# Patient Record
Sex: Male | Born: 1960 | Marital: Married | Smoking: Never smoker
Health system: Southern US, Community
[De-identification: ages and names within clinical notes are randomized; demographics above are authoritative.]

## PROBLEM LIST (undated history)

## (undated) DIAGNOSIS — G479 Sleep disorder, unspecified: Secondary | ICD-10-CM

## (undated) DIAGNOSIS — I10 Essential (primary) hypertension: Secondary | ICD-10-CM

## (undated) DIAGNOSIS — R079 Chest pain, unspecified: Secondary | ICD-10-CM

## (undated) HISTORY — PX: CARDIAC SURGERY: SHX584

---

## 2011-12-02 ENCOUNTER — Ambulatory Visit
Admission: RE | Admit: 2011-12-02 | Discharge: 2011-12-02 | Disposition: A | Discharge status: Home / Self Care | Payer: No Typology Code available for payment source | Source: Ambulatory Visit | Attending: Specialist | Admitting: Specialist

## 2011-12-02 ENCOUNTER — Other Ambulatory Visit: Payer: Self-pay | Admitting: Specialist

## 2011-12-02 DIAGNOSIS — R7611 Nonspecific reaction to tuberculin skin test without active tuberculosis: Secondary | ICD-10-CM

## 2015-10-14 DIAGNOSIS — E785 Hyperlipidemia, unspecified: Secondary | ICD-10-CM | POA: Insufficient documentation

## 2015-12-10 DIAGNOSIS — R7301 Impaired fasting glucose: Secondary | ICD-10-CM | POA: Insufficient documentation

## 2016-05-31 DIAGNOSIS — K760 Fatty (change of) liver, not elsewhere classified: Secondary | ICD-10-CM | POA: Insufficient documentation

## 2016-09-12 HISTORY — PX: CARDIAC SURGERY: SHX584

## 2018-10-26 DIAGNOSIS — I10 Essential (primary) hypertension: Secondary | ICD-10-CM | POA: Insufficient documentation

## 2018-10-26 DIAGNOSIS — I2510 Atherosclerotic heart disease of native coronary artery without angina pectoris: Secondary | ICD-10-CM | POA: Insufficient documentation

## 2018-10-26 DIAGNOSIS — Z951 Presence of aortocoronary bypass graft: Secondary | ICD-10-CM | POA: Insufficient documentation

## 2018-11-05 DIAGNOSIS — M545 Low back pain, unspecified: Secondary | ICD-10-CM | POA: Insufficient documentation

## 2019-05-09 ENCOUNTER — Other Ambulatory Visit: Payer: Self-pay

## 2019-05-09 DIAGNOSIS — Z20822 Contact with and (suspected) exposure to covid-19: Secondary | ICD-10-CM

## 2019-05-11 LAB — NOVEL CORONAVIRUS, NAA: SARS-CoV-2, NAA: NOT DETECTED

## 2019-08-27 DIAGNOSIS — G4733 Obstructive sleep apnea (adult) (pediatric): Secondary | ICD-10-CM | POA: Insufficient documentation

## 2021-08-01 ENCOUNTER — Emergency Department (HOSPITAL_BASED_OUTPATIENT_CLINIC_OR_DEPARTMENT_OTHER)
Admission: EM | Admit: 2021-08-01 | Discharge: 2021-08-01 | Disposition: A | Discharge status: Home / Self Care | Payer: BLUE CROSS/BLUE SHIELD | Attending: Emergency Medicine | Admitting: Emergency Medicine

## 2021-08-01 ENCOUNTER — Emergency Department (HOSPITAL_BASED_OUTPATIENT_CLINIC_OR_DEPARTMENT_OTHER): Payer: BLUE CROSS/BLUE SHIELD

## 2021-08-01 ENCOUNTER — Other Ambulatory Visit: Payer: Self-pay

## 2021-08-01 DIAGNOSIS — R109 Unspecified abdominal pain: Secondary | ICD-10-CM | POA: Insufficient documentation

## 2021-08-01 DIAGNOSIS — R519 Headache, unspecified: Secondary | ICD-10-CM | POA: Diagnosis not present

## 2021-08-01 DIAGNOSIS — M546 Pain in thoracic spine: Secondary | ICD-10-CM | POA: Diagnosis present

## 2021-08-01 DIAGNOSIS — M545 Low back pain, unspecified: Secondary | ICD-10-CM | POA: Diagnosis not present

## 2021-08-01 DIAGNOSIS — M62838 Other muscle spasm: Secondary | ICD-10-CM | POA: Diagnosis not present

## 2021-08-01 DIAGNOSIS — G8929 Other chronic pain: Secondary | ICD-10-CM | POA: Diagnosis not present

## 2021-08-01 DIAGNOSIS — R52 Pain, unspecified: Secondary | ICD-10-CM

## 2021-08-01 LAB — CBC WITH DIFFERENTIAL/PLATELET
Abs Immature Granulocytes: 0.03 10*3/uL (ref 0.00–0.07)
Basophils Absolute: 0.1 10*3/uL (ref 0.0–0.1)
Basophils Relative: 1 %
Eosinophils Absolute: 0.3 10*3/uL (ref 0.0–0.5)
Eosinophils Relative: 4 %
HCT: 40.3 % (ref 39.0–52.0)
Hemoglobin: 13.8 g/dL (ref 13.0–17.0)
Immature Granulocytes: 0 %
Lymphocytes Relative: 32 %
Lymphs Abs: 2.6 10*3/uL (ref 0.7–4.0)
MCH: 29.7 pg (ref 26.0–34.0)
MCHC: 34.2 g/dL (ref 30.0–36.0)
MCV: 86.9 fL (ref 80.0–100.0)
Monocytes Absolute: 0.7 10*3/uL (ref 0.1–1.0)
Monocytes Relative: 9 %
Neutro Abs: 4.4 10*3/uL (ref 1.7–7.7)
Neutrophils Relative %: 54 %
Platelets: 304 10*3/uL (ref 150–400)
RBC: 4.64 MIL/uL (ref 4.22–5.81)
RDW: 12.5 % (ref 11.5–15.5)
WBC: 8 10*3/uL (ref 4.0–10.5)
nRBC: 0 % (ref 0.0–0.2)

## 2021-08-01 LAB — COMPREHENSIVE METABOLIC PANEL
ALT: 14 U/L (ref 0–44)
AST: 13 U/L — ABNORMAL LOW (ref 15–41)
Albumin: 4.4 g/dL (ref 3.5–5.0)
Alkaline Phosphatase: 64 U/L (ref 38–126)
Anion gap: 7 (ref 5–15)
BUN: 11 mg/dL (ref 6–20)
CO2: 31 mmol/L (ref 22–32)
Calcium: 9.5 mg/dL (ref 8.9–10.3)
Chloride: 95 mmol/L — ABNORMAL LOW (ref 98–111)
Creatinine, Ser: 0.78 mg/dL (ref 0.61–1.24)
GFR, Estimated: >60 mL/min (ref >60)
Glucose, Bld: 92 mg/dL (ref 70–99)
Potassium: 3.6 mmol/L (ref 3.5–5.1)
Sodium: 133 mmol/L — ABNORMAL LOW (ref 135–145)
Total Bilirubin: 0.3 mg/dL (ref 0.3–1.2)
Total Protein: 7.6 g/dL (ref 6.5–8.1)

## 2021-08-01 LAB — LIPASE, BLOOD: Lipase: 28 U/L (ref 11–51)

## 2021-08-01 MED ORDER — CYCLOBENZAPRINE HCL 10 MG PO TABS: 10.0000 mg | ORAL_TABLET | Freq: Two times a day (BID) | ORAL | 0 refills | Status: AC | PRN

## 2021-08-01 MED ORDER — IOHEXOL 300 MG/ML  SOLN
100.0000 mL | Freq: Once | INTRAMUSCULAR | Status: AC | PRN
  Administered 2021-08-01: 100 mL via INTRAVENOUS

## 2021-08-01 MED ORDER — DIAZEPAM 5 MG/ML IJ SOLN
2.5000 mg | Freq: Once | INTRAMUSCULAR | Status: AC
  Administered 2021-08-01: 2.5 mg via INTRAVENOUS
  Filled 2021-08-01: qty 2

## 2021-08-01 MED ORDER — METHYLPREDNISOLONE 4 MG PO TBPK: ORAL_TABLET | ORAL | 0 refills | Status: DC

## 2021-08-01 NOTE — ED Provider Notes (Signed)
Craig Jones   CSN: [id number] Arrival date & time: 08/01/21  1754     History Chief Complaint  Patient presents with   Back Pain    Craig Jones is a 59 y.o. male.  Patient here with acute on chronic back pain.  Ongoing for close to a year but happening more in frequency here over the last several months.  Has been taking anti-inflammatories without much relief.  Complaining of headache, back pain all throughout, abdominal pain.  Denies any weakness or numbness.  No loss of bowel or bladder.  Has not had any advanced imaging done by primary care doctor.  The history is provided by the patient.  Back Pain Location:  Thoracic spine and lumbar spine Quality:  Aching Radiates to:  Does not radiate Pain severity:  Moderate Onset quality:  Gradual Duration:  10 months Timing:  Intermittent Progression:  Waxing and waning Chronicity:  Chronic Relieved by:  Nothing Worsened by:  Nothing Ineffective treatments:  None tried Associated symptoms: headaches   Associated symptoms: no abdominal pain, no chest pain, no dysuria, no fever and no numbness       No past medical history on file.  There are no problems to display for this patient.     No family history on file.     Home Medications Prior to Admission medications   Medication Sig Start Date End Date Taking? Authorizing Provider  cyclobenzaprine (FLEXERIL) 10 MG tablet Take 1 tablet (10 mg total) by mouth 2 (two) times daily as needed for up to 20 doses for muscle spasms. 08/01/21  Yes Craig Mccaughey, Craig Jones  methylPREDNISolone (MEDROL DOSEPAK) 4 MG TBPK tablet Follow package insert 08/01/21  Yes Craig Kruck, Craig Jones    Allergies    Patient has no allergy information on record.  Review of Systems   Review of Systems  Constitutional:  Negative for chills and fever.  HENT:  Negative for ear pain and sore throat.   Eyes:  Negative for pain and visual disturbance.   Respiratory:  Negative for cough and shortness of breath.   Cardiovascular:  Negative for chest pain and palpitations.  Gastrointestinal:  Negative for abdominal pain and vomiting.  Genitourinary:  Negative for dysuria and hematuria.  Musculoskeletal:  Positive for back pain. Negative for arthralgias.  Skin:  Negative for color change and rash.  Neurological:  Positive for headaches. Negative for dizziness, tremors, seizures, syncope, facial asymmetry, speech difficulty, light-headedness and numbness.  All other systems reviewed and are negative.  Physical Exam Updated Vital Signs  ED Triage Vitals  Enc Vitals Group     BP 08/01/21 1825 (!) 161/97     Pulse Rate 08/01/21 1825 67     Resp 08/01/21 1825 18     Temp 08/01/21 1825 98 F (36.7 C)     Temp Source 08/01/21 1825 Oral     SpO2 08/01/21 1825 99 %     Weight --      Height --      Head Circumference --      Peak Flow --      Pain Score 08/01/21 1835 8     Pain Loc --      Pain Edu? --      Excl. in [place]? --     Physical Exam Vitals and nursing Jones reviewed.  Constitutional:      General: He is not in acute distress.    Appearance: He is well-developed. He is not  ill-appearing.  HENT:     Head: Normocephalic and atraumatic.     Nose: Nose normal.     Mouth/Throat:     Mouth: Mucous membranes are moist.  Eyes:     Extraocular Movements: Extraocular movements intact.     Conjunctiva/sclera: Conjunctivae normal.     Pupils: Pupils are equal, round, and reactive to light.  Cardiovascular:     Rate and Rhythm: Normal rate and regular rhythm.     Pulses: Normal pulses.     Heart sounds: Normal heart sounds. No murmur heard. Pulmonary:     Effort: Pulmonary effort is normal. No respiratory distress.     Breath sounds: Normal breath sounds.  Abdominal:     General: Abdomen is flat.     Palpations: Abdomen is soft.     Tenderness: There is abdominal tenderness.     Comments: Tenderness to the right side of abdomen   Musculoskeletal:        General: Tenderness present. No swelling. Normal range of motion.     Cervical back: Normal range of motion and neck supple.     Comments: No midline spinal pain, tenderness to paraspinal muscles throughout cervical, thoracic, lumbar spine  Skin:    General: Skin is warm and dry.     Capillary Refill: Capillary refill takes less than 2 seconds.  Neurological:     General: No focal deficit present.     Mental Status: He is alert and oriented to person, place, and time.     Cranial Nerves: No cranial nerve deficit.     Sensory: No sensory deficit.     Motor: No weakness.     Coordination: Coordination normal.     Comments: 5+ out of 5 strength throughout, normal sensation, no drift, normal finger-nose-finger, normal speech  Psychiatric:        Mood and Affect: Mood normal.    ED Results / Procedures / Treatments   Labs (all labs ordered are listed, but only abnormal results are displayed) Labs Reviewed  COMPREHENSIVE METABOLIC PANEL - Abnormal; Notable for the following components:      Result Value   Sodium 133 (*)    Chloride 95 (*)    AST 13 (*)    All other components within normal limits  CBC WITH DIFFERENTIAL/PLATELET  LIPASE, BLOOD    EKG EKG Interpretation  Date/Time:  Sunday August 01 2021 21:21:22 EST Ventricular Rate:  66 PR Interval:  183 QRS Duration: 121 QT Interval:  415 QTC Calculation: 435 R Axis:   -37 Text Interpretation: Sinus rhythm Nonspecific IVCD with LAD Confirmed by Craig Jones ([id number]) on 08/01/2021 9:25:53 PM  Radiology CT Head Wo Contrast  Result Date: 08/01/2021 CLINICAL DATA:  Headache, neck pain, back spasm, back pain. EXAM: CT HEAD WITHOUT CONTRAST CT CERVICAL SPINE WITHOUT CONTRAST CT ABDOMEN AND PELVIS WITH CONTRAST TECHNIQUE: Contiguous axial images were obtained from the base of the skull through the vertex without intravenous contrast. Multidetector CT imaging of the cervical spine was performed without  intravenous contrast. Multiplanar CT image reconstructions were also generated. Multidetector CT imaging of the abdomen and pelvis was performed using the standard protocol following bolus administration of intravenous contrast. CONTRAST:  OMNIPAQUE IOHEXOL 300 MG/ML  SOLN COMPARISON:  None. FINDINGS: CT HEAD FINDINGS Brain: Normal anatomic configuration. Small arachnoid cyst within the left sylvian fissure. No abnormal intra or extra-axial mass lesion. No abnormal mass effect or midline shift. No evidence of acute intracranial hemorrhage or infarct. Ventricular size  is normal. Cerebellum unremarkable. Vascular: Unremarkable Skull: Intact Sinuses/Orbits: Paranasal sinuses are clear. Orbits are unremarkable. Other: Mastoid air cells and middle ear cavities are clear. CT CERVICAL SPINE FINDINGS Alignment: There is overall straightening of the cervical spine. 2 mm retrolisthesis of C5 upon C6, likely degenerative in nature. Skull base and vertebrae: Craniocervical alignment is normal. The atlantodental interval is not widened. No acute fracture of the cervical spine. Vertebral body height has been preserved. Soft tissues and spinal canal: Mild retrolisthesis at C5-6 in combination with posterior disc osteophyte complex results in mild central canal stenosis with minimal flattening of the thecal sac. Mild central posterior disc bulge at C3-4 abuts but does not remodel the thecal sac no canal hematoma. No prevertebral soft tissue swelling or fluid collections are identified. The cervical soft tissues are otherwise unremarkable. Disc levels: There is intervertebral disc space narrowing and endplate remodeling at C5-6 in keeping with changes of moderate degenerative disc disease. Milder degenerative changes are noted at C4-5. The prevertebral soft tissues are not thickened on sagittal reformats. Review of the axial images demonstrates mild bilateral neuroforaminal narrowing at C4-5 and C5-6 secondary to uncovertebral  arthrosis. Remaining neural foramina are widely patent. Upper chest: Negative. Other: None CT ABDOMEN AND PELVIS FINDINGS Lower chest: Coronary artery bypass grafting has been performed. Cardiac size is mildly enlarged. Visualized lung bases are clear. Hepatobiliary: No focal liver abnormality is seen. No gallstones, gallbladder wall thickening, or biliary dilatation. Pancreas: Unremarkable Spleen: Unremarkable Adrenals/Urinary Tract: Adrenal glands are unremarkable. Kidneys are normal, without renal calculi, focal lesion, or hydronephrosis. Bladder is unremarkable. Stomach/Bowel: Stomach is within normal limits. Appendix appears normal. No evidence of bowel wall thickening, distention, or inflammatory changes. Vascular/Lymphatic: Aortic atherosclerosis. No enlarged abdominal or pelvic lymph nodes. Reproductive: Prostate is unremarkable. Other: Small bilateral fat containing inguinal hernias are present. Musculoskeletal: Multiple Schmorl's nodes noted within the lumbar spine within the L1-L4 vertebral bodies. No acute bone abnormality. No lytic or blastic bone lesion identified. IMPRESSION: No acute intracranial abnormality.  No calvarial fracture. No acute fracture or listhesis of the cervical spine. Mild-to-moderate degenerative disc and degenerative joint disease at C4-C6 resulting in mild bilateral neuroforaminal narrowing and mild central canal stenosis. Status post coronary artery bypass grafting.  Mild cardiomegaly. No acute intra-abdominal pathology identified. Electronically Signed   By: Craig Numbers M.D.   On: 08/01/2021 21:53   CT Cervical Spine Wo Contrast  Result Date: 08/01/2021 CLINICAL DATA:  Headache, neck pain, back spasm, back pain. EXAM: CT HEAD WITHOUT CONTRAST CT CERVICAL SPINE WITHOUT CONTRAST CT ABDOMEN AND PELVIS WITH CONTRAST TECHNIQUE: Contiguous axial images were obtained from the base of the skull through the vertex without intravenous contrast. Multidetector CT imaging of the  cervical spine was performed without intravenous contrast. Multiplanar CT image reconstructions were also generated. Multidetector CT imaging of the abdomen and pelvis was performed using the standard protocol following bolus administration of intravenous contrast. CONTRAST:  OMNIPAQUE IOHEXOL 300 MG/ML  SOLN COMPARISON:  None. FINDINGS: CT HEAD FINDINGS Brain: Normal anatomic configuration. Small arachnoid cyst within the left sylvian fissure. No abnormal intra or extra-axial mass lesion. No abnormal mass effect or midline shift. No evidence of acute intracranial hemorrhage or infarct. Ventricular size is normal. Cerebellum unremarkable. Vascular: Unremarkable Skull: Intact Sinuses/Orbits: Paranasal sinuses are clear. Orbits are unremarkable. Other: Mastoid air cells and middle ear cavities are clear. CT CERVICAL SPINE FINDINGS Alignment: There is overall straightening of the cervical spine. 2 mm retrolisthesis of C5 upon C6, likely degenerative  in nature. Skull base and vertebrae: Craniocervical alignment is normal. The atlantodental interval is not widened. No acute fracture of the cervical spine. Vertebral body height has been preserved. Soft tissues and spinal canal: Mild retrolisthesis at C5-6 in combination with posterior disc osteophyte complex results in mild central canal stenosis with minimal flattening of the thecal sac. Mild central posterior disc bulge at C3-4 abuts but does not remodel the thecal sac no canal hematoma. No prevertebral soft tissue swelling or fluid collections are identified. The cervical soft tissues are otherwise unremarkable. Disc levels: There is intervertebral disc space narrowing and endplate remodeling at 075-GRM in keeping with changes of moderate degenerative disc disease. Milder degenerative changes are noted at C4-5. The prevertebral soft tissues are not thickened on sagittal reformats. Review of the axial images demonstrates mild bilateral neuroforaminal narrowing at  C4-5 and C5-6 secondary to uncovertebral arthrosis. Remaining neural foramina are widely patent. Upper chest: Negative. Other: None CT ABDOMEN AND PELVIS FINDINGS Lower chest: Coronary artery bypass grafting has been performed. Cardiac size is mildly enlarged. Visualized lung bases are clear. Hepatobiliary: No focal liver abnormality is seen. No gallstones, gallbladder wall thickening, or biliary dilatation. Pancreas: Unremarkable Spleen: Unremarkable Adrenals/Urinary Tract: Adrenal glands are unremarkable. Kidneys are normal, without renal calculi, focal lesion, or hydronephrosis. Bladder is unremarkable. Stomach/Bowel: Stomach is within normal limits. Appendix appears normal. No evidence of bowel wall thickening, distention, or inflammatory changes. Vascular/Lymphatic: Aortic atherosclerosis. No enlarged abdominal or pelvic lymph nodes. Reproductive: Prostate is unremarkable. Other: Small bilateral fat containing inguinal hernias are present. Musculoskeletal: Multiple Schmorl's nodes noted within the lumbar spine within the L1-L4 vertebral bodies. No acute bone abnormality. No lytic or blastic bone lesion identified. IMPRESSION: No acute intracranial abnormality.  No calvarial fracture. No acute fracture or listhesis of the cervical spine. Mild-to-moderate degenerative disc and degenerative joint disease at C4-C6 resulting in mild bilateral neuroforaminal narrowing and mild central canal stenosis. Status post coronary artery bypass grafting.  Mild cardiomegaly. No acute intra-abdominal pathology identified. Electronically Signed   By: Craig Salisbury M.D.   On: 08/01/2021 21:53   CT Thoracic Spine Wo Contrast  Result Date: 08/01/2021 CLINICAL DATA:  Back pain EXAM: CT Thoracic and Lumbar spine without contrast TECHNIQUE: Multiplanar CT images of the thoracic and lumbar spine were reconstructed from contemporary CT of the Chest, Abdomen, and Pelvis CONTRAST:  None or No additional COMPARISON:  None FINDINGS: CT  THORACIC SPINE FINDINGS Alignment: Normal. Vertebrae: No acute fracture or focal pathologic process. Paraspinal and other soft tissues: Negative. Disc levels: No spinal canal stenosis. CT LUMBAR SPINE FINDINGS Segmentation: 5 lumbar type vertebrae. Alignment: Normal. Vertebrae: Schmorl's nodes at L2, L3 and L4. Paraspinal and other soft tissues: Negative. Disc levels: There is no spinal canal stenosis. There is mild left neural foraminal stenosis at L3-4 and L4-5. IMPRESSION: 1. No acute fracture or static subluxation of the thoracic or lumbar spine. 2. Mild left L3-4 and L4-5 neural foraminal stenosis. Electronically Signed   By: Craig Jarred M.D.   On: 08/01/2021 21:47   CT ABDOMEN PELVIS W CONTRAST  Result Date: 08/01/2021 CLINICAL DATA:  Headache, neck pain, back spasm, back pain. EXAM: CT HEAD WITHOUT CONTRAST CT CERVICAL SPINE WITHOUT CONTRAST CT ABDOMEN AND PELVIS WITH CONTRAST TECHNIQUE: Contiguous axial images were obtained from the base of the skull through the vertex without intravenous contrast. Multidetector CT imaging of the cervical spine was performed without intravenous contrast. Multiplanar CT image reconstructions were also generated. Multidetector CT imaging of the abdomen  and pelvis was performed using the standard protocol following bolus administration of intravenous contrast. CONTRAST:  190mL OMNIPAQUE IOHEXOL 300 MG/ML  SOLN COMPARISON:  None. FINDINGS: CT HEAD FINDINGS Brain: Normal anatomic configuration. Small arachnoid cyst within the left sylvian fissure. No abnormal intra or extra-axial mass lesion. No abnormal mass effect or midline shift. No evidence of acute intracranial hemorrhage or infarct. Ventricular size is normal. Cerebellum unremarkable. Vascular: Unremarkable Skull: Intact Sinuses/Orbits: Paranasal sinuses are clear. Orbits are unremarkable. Other: Mastoid air cells and middle ear cavities are clear. CT CERVICAL SPINE FINDINGS Alignment: There is overall straightening  of the cervical spine. 2 mm retrolisthesis of C5 upon C6, likely degenerative in nature. Skull base and vertebrae: Craniocervical alignment is normal. The atlantodental interval is not widened. No acute fracture of the cervical spine. Vertebral body height has been preserved. Soft tissues and spinal canal: Mild retrolisthesis at C5-6 in combination with posterior disc osteophyte complex results in mild central canal stenosis with minimal flattening of the thecal sac. Mild central posterior disc bulge at C3-4 abuts but does not remodel the thecal sac no canal hematoma. No prevertebral soft tissue swelling or fluid collections are identified. The cervical soft tissues are otherwise unremarkable. Disc levels: There is intervertebral disc space narrowing and endplate remodeling at 075-GRM in keeping with changes of moderate degenerative disc disease. Milder degenerative changes are noted at C4-5. The prevertebral soft tissues are not thickened on sagittal reformats. Review of the axial images demonstrates mild bilateral neuroforaminal narrowing at C4-5 and C5-6 secondary to uncovertebral arthrosis. Remaining neural foramina are widely patent. Upper chest: Negative. Other: None CT ABDOMEN AND PELVIS FINDINGS Lower chest: Coronary artery bypass grafting has been performed. Cardiac size is mildly enlarged. Visualized lung bases are clear. Hepatobiliary: No focal liver abnormality is seen. No gallstones, gallbladder wall thickening, or biliary dilatation. Pancreas: Unremarkable Spleen: Unremarkable Adrenals/Urinary Tract: Adrenal glands are unremarkable. Kidneys are normal, without renal calculi, focal lesion, or hydronephrosis. Bladder is unremarkable. Stomach/Bowel: Stomach is within normal limits. Appendix appears normal. No evidence of bowel wall thickening, distention, or inflammatory changes. Vascular/Lymphatic: Aortic atherosclerosis. No enlarged abdominal or pelvic lymph nodes. Reproductive: Prostate is unremarkable.  Other: Small bilateral fat containing inguinal hernias are present. Musculoskeletal: Multiple Schmorl's nodes noted within the lumbar spine within the L1-L4 vertebral bodies. No acute bone abnormality. No lytic or blastic bone lesion identified. IMPRESSION: No acute intracranial abnormality.  No calvarial fracture. No acute fracture or listhesis of the cervical spine. Mild-to-moderate degenerative disc and degenerative joint disease at C4-C6 resulting in mild bilateral neuroforaminal narrowing and mild central canal stenosis. Status post coronary artery bypass grafting.  Mild cardiomegaly. No acute intra-abdominal pathology identified. Electronically Signed   By: Craig Salisbury M.D.   On: 08/01/2021 21:53   CT L-SPINE NO CHARGE  Result Date: 08/01/2021 CLINICAL DATA:  Back pain EXAM: CT Thoracic and Lumbar spine without contrast TECHNIQUE: Multiplanar CT images of the thoracic and lumbar spine were reconstructed from contemporary CT of the Chest, Abdomen, and Pelvis CONTRAST:  None or No additional COMPARISON:  None FINDINGS: CT THORACIC SPINE FINDINGS Alignment: Normal. Vertebrae: No acute fracture or focal pathologic process. Paraspinal and other soft tissues: Negative. Disc levels: No spinal canal stenosis. CT LUMBAR SPINE FINDINGS Segmentation: 5 lumbar type vertebrae. Alignment: Normal. Vertebrae: Schmorl's nodes at L2, L3 and L4. Paraspinal and other soft tissues: Negative. Disc levels: There is no spinal canal stenosis. There is mild left neural foraminal stenosis at L3-4 and L4-5. IMPRESSION: 1. No acute  fracture or static subluxation of the thoracic or lumbar spine. 2. Mild left L3-4 and L4-5 neural foraminal stenosis. Electronically Signed   By: Craig Jarred M.D.   On: 08/01/2021 21:47    Procedures Procedures   Medications Ordered in ED Medications  diazepam (VALIUM) injection 2.5 mg (2.5 mg Intravenous Given 08/01/21 1947)  iohexol (OMNIPAQUE) 300 MG/ML solution 100 mL (100 mLs Intravenous  Contrast Given 08/01/21 2044)    ED Course  I have reviewed the triage vital signs and the nursing notes.  Pertinent labs & imaging results that were available during my care of the patient were reviewed by me and considered in my medical decision making (see chart for details).    MDM Rules/Calculators/A&P                           Crockett Ridder is here with back pain.  History of CAD.  Has been having acute on chronic back pain for the last several months to over a year.  Increased frequency recently.  Having some abdominal pain as well.  Denies any chest pain or shortness of breath.  No midline spinal pain.  Neurologically he is intact.  No symptoms of cauda equina.  Has normal strength and sensation throughout.  Pain mostly in the paraspinal muscles bilaterally throughout his upper and lower back.  He is very uncomfortable.  Felt much better after IV Valium.  CT scan of his head, spine, abdomen and pelvis were obtained that were overall unremarkable.  No acute process.  He does have some degenerative processes in his cervical spine and lower spine.  No significant anemia or electrolyte abnormality, kidney injury.  No concern for dissection or other cardiac or pulmonary or vascular process.  Has good pulses in his lower extremities.  Overall he is having some right-sided sciatic symptoms.  We will prescribe Medrol Dosepak and Flexeril and have him follow-up with sports medicine.  Discharged in good condition.  Understands return precautions.  This chart was dictated using voice recognition software.  Despite best efforts to proofread,  errors can occur which can change the documentation meaning.   Final Clinical Impression(s) / ED Diagnoses Final diagnoses:  Pain  Chronic back pain, unspecified back location, unspecified back pain laterality  Nonintractable headache, unspecified chronicity pattern, unspecified headache type  Muscle spasm    Rx / DC Orders ED Discharge Orders           Ordered    methylPREDNISolone (MEDROL DOSEPAK) 4 MG TBPK tablet        08/01/21 2215    cyclobenzaprine (FLEXERIL) 10 MG tablet  2 times daily PRN        08/01/21 2215             Craig Sites, Craig Jones 08/01/21 2218

## 2021-08-01 NOTE — ED Triage Notes (Signed)
He c/o ~ 3 month hx of occasional, severe, debilitating "back spasms". He expresses frustration that his pcp is not performing more sophisticated testing, such as CT. He denies any paresthesias and is in no distress.

## 2021-08-01 NOTE — Discharge Instructions (Addendum)
Recommend 1000 mg of Tylenol every 6 hours as needed for pain.  Recommend 800 mg ibuprofen every 8 hours as needed for pain.  Take Medrol Dosepak as prescribed.  Take Flexeril as prescribed.  Be careful as Flexeril can be sedating as discussed.  Do not drive or operate heavy machinery while using this medicine.  Follow-up with sports medicine doctor.

## 2021-09-03 ENCOUNTER — Encounter (HOSPITAL_BASED_OUTPATIENT_CLINIC_OR_DEPARTMENT_OTHER): Payer: Self-pay

## 2021-09-03 ENCOUNTER — Emergency Department (HOSPITAL_BASED_OUTPATIENT_CLINIC_OR_DEPARTMENT_OTHER)
Admission: EM | Admit: 2021-09-03 | Discharge: 2021-09-03 | Disposition: A | Discharge status: Home / Self Care | Payer: BLUE CROSS/BLUE SHIELD | Attending: Emergency Medicine | Admitting: Emergency Medicine

## 2021-09-03 ENCOUNTER — Emergency Department (HOSPITAL_BASED_OUTPATIENT_CLINIC_OR_DEPARTMENT_OTHER): Payer: BLUE CROSS/BLUE SHIELD

## 2021-09-03 ENCOUNTER — Emergency Department (HOSPITAL_BASED_OUTPATIENT_CLINIC_OR_DEPARTMENT_OTHER): Payer: BLUE CROSS/BLUE SHIELD | Admitting: Radiology

## 2021-09-03 ENCOUNTER — Other Ambulatory Visit: Payer: Self-pay

## 2021-09-03 DIAGNOSIS — I2510 Atherosclerotic heart disease of native coronary artery without angina pectoris: Secondary | ICD-10-CM | POA: Insufficient documentation

## 2021-09-03 DIAGNOSIS — R9431 Abnormal electrocardiogram [ECG] [EKG]: Secondary | ICD-10-CM | POA: Insufficient documentation

## 2021-09-03 DIAGNOSIS — R071 Chest pain on breathing: Secondary | ICD-10-CM | POA: Insufficient documentation

## 2021-09-03 DIAGNOSIS — R079 Chest pain, unspecified: Secondary | ICD-10-CM

## 2021-09-03 DIAGNOSIS — J9811 Atelectasis: Secondary | ICD-10-CM | POA: Insufficient documentation

## 2021-09-03 LAB — TROPONIN I (HIGH SENSITIVITY)
Troponin I (High Sensitivity): 4 ng/L (ref <18)
Troponin I (High Sensitivity): 4 ng/L (ref <18)

## 2021-09-03 LAB — BASIC METABOLIC PANEL
Anion gap: 9 (ref 5–15)
BUN: 17 mg/dL (ref 6–20)
CO2: 30 mmol/L (ref 22–32)
Calcium: 9.4 mg/dL (ref 8.9–10.3)
Chloride: 91 mmol/L — ABNORMAL LOW (ref 98–111)
Creatinine, Ser: 0.81 mg/dL (ref 0.61–1.24)
GFR, Estimated: >60 mL/min (ref >60)
Glucose, Bld: 101 mg/dL — ABNORMAL HIGH (ref 70–99)
Potassium: 3 mmol/L — ABNORMAL LOW (ref 3.5–5.1)
Sodium: 130 mmol/L — ABNORMAL LOW (ref 135–145)

## 2021-09-03 LAB — CBC
HCT: 42.5 % (ref 39.0–52.0)
Hemoglobin: 14.3 g/dL (ref 13.0–17.0)
MCH: 29.1 pg (ref 26.0–34.0)
MCHC: 33.6 g/dL (ref 30.0–36.0)
MCV: 86.4 fL (ref 80.0–100.0)
Platelets: 422 10*3/uL — ABNORMAL HIGH (ref 150–400)
RBC: 4.92 MIL/uL (ref 4.22–5.81)
RDW: 13.1 % (ref 11.5–15.5)
WBC: 11 10*3/uL — ABNORMAL HIGH (ref 4.0–10.5)
nRBC: 0 % (ref 0.0–0.2)

## 2021-09-03 MED ORDER — IOHEXOL 350 MG/ML SOLN
100.0000 mL | Freq: Once | INTRAVENOUS | Status: AC | PRN
  Administered 2021-09-03: 04:00:00 100 mL via INTRAVENOUS

## 2021-09-03 MED ORDER — HYDROCODONE-ACETAMINOPHEN 5-325 MG PO TABS
2.0000 | ORAL_TABLET | Freq: Once | ORAL | Status: AC
  Administered 2021-09-03: 07:00:00 2 via ORAL
  Filled 2021-09-03: qty 2

## 2021-09-03 MED ORDER — HYDROCODONE-ACETAMINOPHEN 5-325 MG PO TABS: 2.0000 | ORAL_TABLET | ORAL | 0 refills | Status: DC | PRN

## 2021-09-03 MED ORDER — MORPHINE SULFATE (PF) 4 MG/ML IV SOLN
4.0000 mg | Freq: Once | INTRAVENOUS | Status: AC
  Administered 2021-09-03: 04:00:00 4 mg via INTRAVENOUS
  Filled 2021-09-03: qty 1

## 2021-09-03 NOTE — ED Notes (Signed)
Pt given morphine for pain and gone to CT

## 2021-09-03 NOTE — ED Provider Notes (Signed)
[facility] EMERGENCY DEPT Provider Note   CSN: [id number] Arrival date & time: 09/03/21  0128     History Chief Complaint  Patient presents with   Chest Pain   Shortness of Breath    Craig Jones is a 60 y.o. male.  Patient is a 60 year old male patient who presents with chest pain.  He has had a prior history of coronary artery disease status post bypass surgery 4 years ago.  He reports a 2-day history of pain in his left chest.  Its otherwise nonradiating.  Describes it as a sharp pain that is worse with movement and worse with deep breathing.  He says it is very intense.  He says he feels associated shortness of breath and nausea.  Its been constant for the last 2 days.  It started suddenly.  He has no known injuries.  He does have some ongoing pain in his left neck and the left side of his head.  He had a recent MRI.  He has been treated by his doctors for the cervical pain.  He says the chest pain does not feel related to the neck pain.  He denies any numbness or weakness to his extremities.  No cough or cold symptoms.  History was obtained through video language line.      History reviewed. No pertinent past medical history.  There are no problems to display for this patient.    The histories are not reviewed yet. Please review them in the "History" navigator section and refresh this [place].     No family history on file.     Home Medications Prior to Admission medications   Medication Sig Start Date End Date Taking? Authorizing Provider  HYDROcodone-acetaminophen (NORCO/VICODIN) 5-325 MG tablet Take 2 tablets by mouth every 4 (four) hours as needed. 09/03/21  Yes Malvin Johns, MD  cyclobenzaprine (FLEXERIL) 10 MG tablet Take 1 tablet (10 mg total) by mouth 2 (two) times daily as needed for up to 20 doses for muscle spasms. 08/01/21   Lennice Sites, DO  methylPREDNISolone (MEDROL DOSEPAK) 4 MG TBPK tablet Follow package insert 08/01/21   Lennice Sites, DO    Allergies    Patient has no known allergies.  Review of Systems   Review of Systems  Constitutional:  Negative for chills, diaphoresis, fatigue and fever.  HENT:  Negative for congestion, rhinorrhea and sneezing.   Eyes: Negative.   Respiratory:  Positive for shortness of breath. Negative for cough and chest tightness.   Cardiovascular:  Positive for chest pain. Negative for leg swelling.  Gastrointestinal:  Negative for abdominal pain, blood in stool, diarrhea, nausea and vomiting.  Genitourinary:  Negative for difficulty urinating, flank pain, frequency and hematuria.  Musculoskeletal:  Positive for back pain and neck pain. Negative for arthralgias.       Neck and back pain which have been going on for several months  Skin:  Negative for rash.  Neurological:  Negative for dizziness, speech difficulty, weakness, numbness and headaches.   Physical Exam Updated Vital Signs BP 137/89    Pulse 77    Temp 98.5 F (36.9 C)    Resp 16    SpO2 96%   Physical Exam Constitutional:      Appearance: He is well-developed.  HENT:     Head: Normocephalic and atraumatic.  Eyes:     Pupils: Pupils are equal, round, and reactive to light.  Cardiovascular:     Rate and Rhythm: Normal rate and regular rhythm.  Heart sounds: Normal heart sounds.  Pulmonary:     Effort: Pulmonary effort is normal. No respiratory distress.     Breath sounds: Normal breath sounds. No wheezing or rales.  Chest:     Chest wall: Tenderness (Tenderness on palpation of the left chest wall, no crepitus or deformity, no rashes) present.  Abdominal:     General: Bowel sounds are normal.     Palpations: Abdomen is soft.     Tenderness: There is no abdominal tenderness. There is no guarding or rebound.  Musculoskeletal:        General: Normal range of motion.     Cervical back: Normal range of motion and neck supple.     Comments: Radial pulses are intact, he has normal sensation and motor function to  the upper extremities, pedal pulses are intact  Lymphadenopathy:     Cervical: No cervical adenopathy.  Skin:    General: Skin is warm and dry.     Findings: No rash.  Neurological:     Mental Status: He is alert and oriented to person, place, and time.    ED Results / Procedures / Treatments   Labs (all labs ordered are listed, but only abnormal results are displayed) Labs Reviewed  BASIC METABOLIC PANEL - Abnormal; Notable for the following components:      Result Value   Sodium 130 (*)    Potassium 3.0 (*)    Chloride 91 (*)    Glucose, Bld 101 (*)    All other components within normal limits  CBC - Abnormal; Notable for the following components:   WBC 11.0 (*)    Platelets 422 (*)    All other components within normal limits  TROPONIN I (HIGH SENSITIVITY)  TROPONIN I (HIGH SENSITIVITY)    EKG EKG Interpretation  Date/Time:  Friday September 03 2021 01:49:24 EST Ventricular Rate:  85 PR Interval:  166 QRS Duration: 112 QT Interval:  370 QTC Calculation: 440 R Axis:   75 Text Interpretation: Normal sinus rhythm Abnormal QRS-T angle, consider primary T wave abnormality Abnormal ECG since last tracing no significant change Confirmed by Malvin Johns [phone]) on 09/03/2021 2:35:01 AM  Radiology DG Chest 2 View  Result Date: 09/03/2021 CLINICAL DATA:  Chest pain. EXAM: CHEST - 2 VIEW COMPARISON:  12/02/2011 FINDINGS: Lungs are clear. No pneumothorax or pleural effusion. Coronary artery bypass grafting has been performed. Cardiac size within normal limits. Pulmonary vascularity is normal. No acute bone abnormality. IMPRESSION: No active cardiopulmonary disease. Electronically Signed   By: Fidela Salisbury M.D.   On: 09/03/2021 03:10   CT Angio Chest/Abd/Pel for Dissection W and/or W/WO  Result Date: 09/03/2021 CLINICAL DATA:  60 year old male with chest and back pain since last night. EXAM: CT ANGIOGRAPHY CHEST, ABDOMEN AND PELVIS TECHNIQUE: Non-contrast CT of the chest  was initially obtained. Multidetector CT imaging through the chest, abdomen and pelvis was performed using the standard protocol during bolus administration of intravenous contrast. Multiplanar reconstructed images and MIPs were obtained and reviewed to evaluate the vascular anatomy. CONTRAST:  156mL OMNIPAQUE IOHEXOL 350 MG/ML SOLN COMPARISON:  Chest radiographs 0233 hours today. CT Abdomen and Pelvis 08/01/2021. FINDINGS: CTA CHEST FINDINGS Cardiovascular: Prior sternotomy and CABG. Stable borderline cardiomegaly. No pericardial effusion. Aortic atherosclerosis but negative for thoracic aortic dissection or aneurysm. Patent proximal great vessels. Central pulmonary arteries are also enhancing and appear to be patent. Mediastinum/Nodes: Negative. No mediastinal mass or lymphadenopathy. Lungs/Pleura: Major airways are patent. There is mild dependent pulmonary atelectasis  similar to the November CT. No pneumothorax, pleural effusion, or other acute pulmonary abnormality. Musculoskeletal: Prior sternotomy. No acute osseous abnormality identified. Review of the MIP images confirms the above findings. CTA ABDOMEN AND PELVIS FINDINGS VASCULAR Negative for abdominal aortic aneurysm or dissection. Stable Aortoiliac calcified atherosclerosis, with primarily distal aortic atherosclerosis. Bilateral iliac artery tortuosity, plaque and stenosis but the major arterial structures in the abdomen and pelvis remain patent. Review of the MIP images confirms the above findings. NON-VASCULAR Hepatobiliary: Stable, negative liver and gallbladder. Pancreas: Negative. Spleen: Negative. Adrenals/Urinary Tract: Normal adrenal glands. Symmetric, nonobstructed kidneys. Decompressed ureters. Distended urinary bladder (510 mL). No perivesical stranding. Ureters are within normal limits. No urinary calculus. Stomach/Bowel: Mild retained stool throughout the large bowel with occasional diverticula. Normal appendix on series 5, image 241. No  large bowel inflammation. Negative terminal ileum. No dilated small bowel. Unremarkable stomach and duodenum. No free air, free fluid, mesenteric stranding. Lymphatic: No lymphadenopathy. Reproductive: Negative. Other: No pelvic free fluid. Musculoskeletal: Multilevel lumbar endplate Schmorl's nodes are stable. No acute osseous abnormality identified. Review of the MIP images confirms the above findings. IMPRESSION: 1. Negative for aortic dissection or aneurysm. Positive for Aortic Atherosclerosis (ICD10-I70.0), prior CABG. 2. Distended urinary bladder (510 mL).  Query urinary retention. 3. Mild pulmonary atelectasis. 4. No other acute or inflammatory process identified in the chest, abdomen, or pelvis. Electronically Signed   By: Odessa Fleming M.D.   On: 09/03/2021 05:23    Procedures Procedures   Medications Ordered in ED Medications  morphine 4 MG/ML injection 4 mg (4 mg Intravenous Given 09/03/21 0410)  iohexol (OMNIPAQUE) 350 MG/ML injection 100 mL (100 mLs Intravenous Contrast Given 09/03/21 0421)    ED Course  I have reviewed the triage vital signs and the nursing notes.  Pertinent labs & imaging results that were available during my care of the patient were reviewed by me and considered in my medical decision making (see chart for details).    MDM Rules/Calculators/A&P                         Patient is a 60 year old male who presents with left-sided chest pain.  He feels like it is made worse from his worsening neck pain and has had recently although it does not seem to be radiating from his neck.  It is worse with movement and deep breathing.  It is worse when he moves his left arm around.  It somewhat reproducible.  He has no other associated symptoms.  He has had 2 negative troponins.  No ischemic changes noted on EKG.  He had a CTA of his chest which shows a normal aorta.  No obvious PE.  No hypoxia or tachycardia or other suggestions of PE.  It seems to be musculoskeletal in nature,  possible pleurisy.  He is improved after treatment with pain medication in the ED.  I have a low suspicion for ACS.  Its very atypical.  He was discharged home in good condition.  He was given a prescription for small amount of pain medicine and encouraged to have close follow-up with his PCP.  Return precautions were given.  Of note his urinary bladder was distended on the CT.  Once he got back from CT, he urinated about a liter.  He has no suggestions of retention.    Final Clinical Impression(s) / ED Diagnoses Final diagnoses:  Chest pain, unspecified type    Rx / DC Orders ED Discharge Orders  Ordered    HYDROcodone-acetaminophen (NORCO/VICODIN) 5-325 MG tablet  Every 4 hours PRN        09/03/21 [id number]             Malvin Johns, MD 09/03/21 [phone]

## 2021-09-03 NOTE — ED Triage Notes (Signed)
°  Patient comes in with chest pain that started last night.  Patient endorses L sided chest pain, with left arm pain, dyspnea, and nausea.  Patient had open heart surgery 4 years ago.  Family states he also has cervical injury that causes him pain that is hard to manage.  Pain 10/10 pressure in chest.

## 2021-09-03 NOTE — Discharge Instructions (Signed)
Follow-up with your primary care doctor.  Return here as needed for any worsening symptoms. 

## 2021-09-17 ENCOUNTER — Emergency Department (HOSPITAL_BASED_OUTPATIENT_CLINIC_OR_DEPARTMENT_OTHER): Payer: 59 | Admitting: Radiology

## 2021-09-17 ENCOUNTER — Emergency Department (HOSPITAL_BASED_OUTPATIENT_CLINIC_OR_DEPARTMENT_OTHER)
Admission: EM | Admit: 2021-09-17 | Discharge: 2021-09-17 | Disposition: A | Discharge status: Home / Self Care | Payer: 59 | Attending: Emergency Medicine | Admitting: Emergency Medicine

## 2021-09-17 ENCOUNTER — Other Ambulatory Visit: Payer: Self-pay

## 2021-09-17 ENCOUNTER — Encounter (HOSPITAL_BASED_OUTPATIENT_CLINIC_OR_DEPARTMENT_OTHER): Payer: Self-pay | Admitting: Obstetrics and Gynecology

## 2021-09-17 DIAGNOSIS — G894 Chronic pain syndrome: Secondary | ICD-10-CM | POA: Diagnosis not present

## 2021-09-17 DIAGNOSIS — Z20822 Contact with and (suspected) exposure to covid-19: Secondary | ICD-10-CM | POA: Insufficient documentation

## 2021-09-17 DIAGNOSIS — G4700 Insomnia, unspecified: Secondary | ICD-10-CM | POA: Insufficient documentation

## 2021-09-17 HISTORY — DX: Essential (primary) hypertension: I10

## 2021-09-17 HISTORY — DX: Sleep disorder, unspecified: G47.9

## 2021-09-17 HISTORY — DX: Chest pain, unspecified: R07.9

## 2021-09-17 LAB — BASIC METABOLIC PANEL
Anion gap: 9 (ref 5–15)
BUN: 12 mg/dL (ref 6–20)
CO2: 29 mmol/L (ref 22–32)
Calcium: 9.6 mg/dL (ref 8.9–10.3)
Chloride: 92 mmol/L — ABNORMAL LOW (ref 98–111)
Creatinine, Ser: 0.78 mg/dL (ref 0.61–1.24)
GFR, Estimated: >60 mL/min (ref >60)
Glucose, Bld: 111 mg/dL — ABNORMAL HIGH (ref 70–99)
Potassium: 3.7 mmol/L (ref 3.5–5.1)
Sodium: 130 mmol/L — ABNORMAL LOW (ref 135–145)

## 2021-09-17 LAB — RESP PANEL BY RT-PCR (FLU A&B, COVID) ARPGX2
Influenza A by PCR: NEGATIVE
Influenza B by PCR: NEGATIVE
SARS Coronavirus 2 by RT PCR: NEGATIVE

## 2021-09-17 LAB — CBC
HCT: 39.6 % (ref 39.0–52.0)
Hemoglobin: 13.5 g/dL (ref 13.0–17.0)
MCH: 29 pg (ref 26.0–34.0)
MCHC: 34.1 g/dL (ref 30.0–36.0)
MCV: 85 fL (ref 80.0–100.0)
Platelets: 457 10*3/uL — ABNORMAL HIGH (ref 150–400)
RBC: 4.66 MIL/uL (ref 4.22–5.81)
RDW: 12.9 % (ref 11.5–15.5)
WBC: 11 10*3/uL — ABNORMAL HIGH (ref 4.0–10.5)
nRBC: 0 % (ref 0.0–0.2)

## 2021-09-17 LAB — TROPONIN I (HIGH SENSITIVITY)
Troponin I (High Sensitivity): 2 ng/L (ref <18)
Troponin I (High Sensitivity): 2 ng/L (ref <18)

## 2021-09-17 LAB — D-DIMER, QUANTITATIVE: D-Dimer, Quant: 0.47 ug/mL-FEU (ref 0.00–0.50)

## 2021-09-17 MED ORDER — SODIUM CHLORIDE 0.9 % IV SOLN
1000.0000 mL | INTRAVENOUS | Status: DC
  Administered 2021-09-17: 1000 mL via INTRAVENOUS

## 2021-09-17 MED ORDER — SODIUM CHLORIDE 0.9 % IV BOLUS (SEPSIS)
1000.0000 mL | Freq: Once | INTRAVENOUS | Status: AC
Start: 2021-09-17 — End: 2021-09-17
  Administered 2021-09-17: 1000 mL via INTRAVENOUS

## 2021-09-17 NOTE — ED Notes (Signed)
RN provided AVS using Teachback Method. Patient verbalizes understanding of Discharge Instructions. Opportunity for Questioning and Answers were provided by RN. Patient Discharged from ED in wheelchair to Home with Family. ? ?

## 2021-09-17 NOTE — ED Triage Notes (Addendum)
Patient reports to the ER after he got a call from his PCP. Patient reports he was told that he had an abnormal lab. Patient had low sodium on a recent BMP that was taken yesterday. Patient reports ShoB. Patient reports yesterday he was started on Baclofen and Cymbalta.

## 2021-09-17 NOTE — ED Notes (Signed)
RN provided AVS using Teachback Method. Patient verbalizes understanding of Discharge Instructions. Opportunity for Questioning and Answers were provided by RN. Patient Discharged from ED.  ° °

## 2021-09-17 NOTE — ED Provider Notes (Signed)
[facility] EMERGENCY DEPT Provider Note   CSN: [id number] Arrival date & time: 09/17/21  1547     History  Chief Complaint  Patient presents with   Shortness of Breath    Craig Jones is a 61 y.o. male.   Shortness of Breath Associated symptoms: chest pain and headaches   Associated symptoms: no fever    Patient has a complex history of multiple medical problems.  Patient has history of recurrent chest pain, hypertension, sleep disturbance as well as chronic pain.  He has had extensive evaluations with his primary doctor as well as cardiology orthopedic surgery and has now been referred to a neurologist.  Patient states he has been having issues for 2 years now with pain in his spine and his head.  The pain is severe and affects his sleep.  He has been on multiple medications including morphine and Medrol muscle relaxants and gabapentin without relief.  He has pain in his sleep as well as with breathing.  Patient states he was seen at his doctor's office yesterday.  Patient states he was called today and his doctor told him he needed to go to the emergency room immediately to be admitted to the hospital.  I have reviewed records from the patient's outpatient visit yesterday.  He is getting referred to a neurologist.  Is also getting referred to a pain clinic.  He has been evaluated by rheumatology and orthopedics.  Patient did have laboratory test that showed his sodium level was 127 and his chloride was 90.  There is no notes indicating that he needed to come to the emergency room or to be hospitalized.  Is possible he may not have updated those notes  Home Medications Prior to Admission medications   Medication Sig Start Date End Date Taking? Authorizing Provider  cyclobenzaprine (FLEXERIL) 10 MG tablet Take 1 tablet (10 mg total) by mouth 2 (two) times daily as needed for up to 20 doses for muscle spasms. 08/01/21   Curatolo, Adam, DO  HYDROcodone-acetaminophen  (NORCO/VICODIN) 5-325 MG tablet Take 2 tablets by mouth every 4 (four) hours as needed. 09/03/21   Rolan Bucco, MD  methylPREDNISolone (MEDROL DOSEPAK) 4 MG TBPK tablet Follow package insert 08/01/21   Virgina Norfolk, DO      Allergies    Patient has no known allergies.    Review of Systems   Review of Systems  Constitutional:  Negative for fever.  Respiratory:  Positive for shortness of breath.   Cardiovascular:  Positive for chest pain.  Genitourinary:  Negative for dysuria.  Neurological:  Positive for headaches.   Physical Exam Updated Vital Signs BP (!) 158/94    Pulse 80    Temp 98.5 F (36.9 C)    Resp (!) 26    Ht 1.702 m (5\' 7" )    Wt 90.7 kg    SpO2 98%    BMI 31.32 kg/m  Physical Exam Vitals and nursing note reviewed.  Constitutional:      Appearance: He is well-developed. He is not ill-appearing or diaphoretic.  HENT:     Head: Normocephalic and atraumatic.     Right Ear: External ear normal.     Left Ear: External ear normal.  Eyes:     General: No scleral icterus.       Right eye: No discharge.        Left eye: No discharge.     Conjunctiva/sclera: Conjunctivae normal.  Neck:     Trachea: No tracheal deviation.  Cardiovascular:     Rate and Rhythm: Normal rate and regular rhythm.  Pulmonary:     Effort: Pulmonary effort is normal. No respiratory distress.     Breath sounds: Normal breath sounds. No stridor. No wheezing or rales.  Abdominal:     General: Bowel sounds are normal. There is no distension.     Palpations: Abdomen is soft.     Tenderness: There is no abdominal tenderness. There is no guarding or rebound.  Musculoskeletal:        General: No tenderness or deformity.     Cervical back: Neck supple.     Comments: Tenderness in the neck and spine  Skin:    General: Skin is warm and dry.     Findings: No rash.  Neurological:     General: No focal deficit present.     Mental Status: He is alert.     Cranial Nerves: No cranial nerve deficit  (no facial droop, extraocular movements intact, no slurred speech).     Sensory: No sensory deficit.     Motor: No abnormal muscle tone or seizure activity.     Coordination: Coordination normal.  Psychiatric:        Mood and Affect: Mood normal.    ED Results / Procedures / Treatments   Labs (all labs ordered are listed, but only abnormal results are displayed) Labs Reviewed  BASIC METABOLIC PANEL - Abnormal; Notable for the following components:      Result Value   Sodium 130 (*)    Chloride 92 (*)    Glucose, Bld 111 (*)    All other components within normal limits  CBC - Abnormal; Notable for the following components:   WBC 11.0 (*)    Platelets 457 (*)    All other components within normal limits  RESP PANEL BY RT-PCR (FLU A&B, COVID) [id number]  D-DIMER, QUANTITATIVE  TROPONIN I (HIGH SENSITIVITY)  TROPONIN I (HIGH SENSITIVITY)    EKG EKG Interpretation  Date/Time:  Friday September 17 2021 15:59:41 EST Ventricular Rate:  83 PR Interval:  154 QRS Duration: 104 QT Interval:  382 QTC Calculation: 448 R Axis:   -4 Text Interpretation: Normal sinus rhythm Normal ECG When compared with ECG of 03-Sep-2021 03:13, No significant change since last tracing Confirmed by Linwood DibblesKnapp, Tori Dattilio [phone]([id number]) on 09/17/2021 4:05:35 PM  Radiology DG Chest 2 View  Result Date: 09/17/2021 CLINICAL DATA:  Shortness of breath for the past 2 months. EXAM: CHEST - 2 VIEW COMPARISON:  Chest x-ray dated September 03, 2021. FINDINGS: Stable cardiomediastinal silhouette status post CABG. Unchanged mild bilateral lower lobe atelectasis. No focal consolidation, pleural effusion, or pneumothorax. No acute osseous abnormality. IMPRESSION: No active cardiopulmonary disease. Electronically Signed   By: Obie DredgeWilliam T Derry M.D.   On: 09/17/2021 16:27    Procedures Procedures    Medications Ordered in ED Medications  sodium chloride 0.9 % bolus 1,000 mL (0 mLs Intravenous Stopped 09/17/21 1916)    Followed by  0.9 %  sodium  chloride infusion (1,000 mLs Intravenous New Bag/Given 09/17/21 1917)    ED Course/ Medical Decision Making/ A&P Clinical Course as of 09/17/21 1956  Fri Sep 17, 2021  1805 CBC essentially normal.  Slight increase in white count 11 [JK]  1805 metabolic panel does show hyponatremia and hypochloremia however this is the same as it was 2 weeks ago.  Several months ago his sodium level was 133 this is not an acute change [JK]  1805 Initial troponin is normal.  Chest x-ray images and report reviewed by me no acute findings [JK]  1805 We will add on covid flu and check a D-dimer [JK]  1932 Serial troponins normal.  COVID and flu are negative.  D-dimer is negative. [JK]    Clinical Course User Index [JK] Linwood Dibbles, MD                           Medical Decision Making  Patient's ED work-up is reassuring.  No signs of anemia.  No signs of acute coronary syndrome.  COVID and flu is negative.  D-dimer is negative.  Patient is hyponatremic however this is not significantly changed from his baseline.  He was given saline.  Do not feel that he requires admission for hyponatremia.  Patient is quite upset and depressed regarding his persistent symptoms of headaches and neck pains and back pains and insomnia.  He has had extensive outpatient evaluation and has been evaluated by rheumatology internal medicine cardiology and orthopedic surgery.  Unfortunately I do not have any answers to for him this evening regarding his chronic pain and his insomnia but I do not feel he needs any imaging at this time considering his extensive previous work-up.  Patient also requested to see a specialist however he came to the freestanding ED and there are no other doctors available to see him here.  Right now he does not require any hospitalization based on his work-up in the ED.  Recommend outpatient follow-up with his neurologist and primary care doctors as planned.        Final Clinical Impression(s) / ED  Diagnoses Final diagnoses:  Insomnia, unspecified type  Chronic pain syndrome    Rx / DC Orders ED Discharge Orders     None         Linwood Dibbles, MD 09/17/21 1956

## 2021-09-17 NOTE — ED Notes (Signed)
ED Provider at bedside. 

## 2021-09-17 NOTE — Discharge Instructions (Signed)
Continue your current medications.  Follow-up with your specialist as planned.

## 2021-10-05 ENCOUNTER — Encounter: Payer: Self-pay | Admitting: Neurology

## 2021-10-19 ENCOUNTER — Other Ambulatory Visit (INDEPENDENT_AMBULATORY_CARE_PROVIDER_SITE_OTHER): Payer: 59

## 2021-10-19 ENCOUNTER — Other Ambulatory Visit: Payer: Self-pay

## 2021-10-19 ENCOUNTER — Encounter: Payer: Self-pay | Admitting: Neurology

## 2021-10-19 ENCOUNTER — Ambulatory Visit (INDEPENDENT_AMBULATORY_CARE_PROVIDER_SITE_OTHER): Payer: 59 | Admitting: Neurology

## 2021-10-19 VITALS — BP 172/71 | HR 76 | Ht 67.0 in | Wt 194.0 lb

## 2021-10-19 DIAGNOSIS — M5481 Occipital neuralgia: Secondary | ICD-10-CM

## 2021-10-19 DIAGNOSIS — R413 Other amnesia: Secondary | ICD-10-CM

## 2021-10-19 DIAGNOSIS — I10 Essential (primary) hypertension: Secondary | ICD-10-CM

## 2021-10-19 NOTE — Patient Instructions (Signed)
MRI of brain without contrast Check B12 and TSH Refer to pain management for occipital neuralgia Refer to Leanord Hawking for neuropsychological evaluation at [facility]

## 2021-10-19 NOTE — Progress Notes (Signed)
NEUROLOGY CONSULTATION NOTE  Craig Jones MRN: [id number] DOB: Jul 29, 1961  Referring provider: Dennis Bast, MD Primary care provider: Dennis Bast, MD  Reason for consult:  headache  Assessment/Plan:   Bilateral occipital neuralgia - he does not want to increase current medications (such as gabapentin) due to potential side effects.  I think he is in too much pain to tolerate physical therapy.  I think he would best be treated by pain management as he may benefit from an injection/nerve block Memory deficits - may be related to anxiety, pain or medication side effect.  Patient is concerned about underlying cognitive disorder Hypertension  Due to worsening headache and memory deficits, check MRI of brain without contrast Refer to pain management Regarding memory: Check B12 and TSH Due to language barrier, will refer for neuropsychological testing to Dr. Dwana Jones at [facility] who conducts Spanish evaluations. Follow up with PCP regarding blood pressure Follow up as needed.  If neuropsychological testing is concerning for underlying neurodegenerative disease, he may follow up with Craig Kays, PA-C    Subjective:  Craig Jones is a 61 year old male who presents for headache.  History supplemented by orthopedics and internal medicine notes.  He is accompanied by his daughter.  He has had posterior headaches for many years.  He had open heart surgery in 2018 and they seemed to improve but never resolved.  Over the past few months, they have gotten worse.  He endorses a severe electric pain radiating up the back of the head bilaterally to behind the ears.  He has some neck pain.  He needs to sleep on his side due to the pain.  He needs to take Tylenol every night.  CT head on 08/01/2021 personally reviewed showed small arachnoid cyst within the left sylvian fissure but no acute intracranial abnormality.  MRI of cervical spine on 08/28/2021 personally reviewed showed mid  degenerative disc disease most prominent at C5-6 with mild foraminal narrowing at right C4-5, left C5-6 and left C6-7 but no significant central canal stenosis.  Saw orthopedics.  Nothing they can do.  He is on multiple medications that do not seem to help.  He also reports memory problems.  He has failed his citizenship exam several times already.  He reports that he had a head injury at age 62 in which he lost consciousness.  Current medications:  cyclobenzaprine 10mg  BID PRN, baclofen 5mg  daily, duloxetine 60mg  daily, gabapentin 300mg  at bedtime hydrocodone-acetaminophen Past medications: morphine, Medrol Dosepak     PAST MEDICAL HISTORY: Past Medical History:  Diagnosis Date   Chest pain    Hypertension    Sleep disturbance     PAST SURGICAL HISTORY: Past Surgical History:  Procedure Laterality Date   CARDIAC SURGERY      MEDICATIONS: Current Outpatient Medications on File Prior to Visit  Medication Sig Dispense Refill   cyclobenzaprine (FLEXERIL) 10 MG tablet Take 1 tablet (10 mg total) by mouth 2 (two) times daily as needed for up to 20 doses for muscle spasms. 20 tablet 0   HYDROcodone-acetaminophen (NORCO/VICODIN) 5-325 MG tablet Take 2 tablets by mouth every 4 (four) hours as needed. 12 tablet 0   methylPREDNISolone (MEDROL DOSEPAK) 4 MG TBPK tablet Follow package insert 21 each 0   No current facility-administered medications on file prior to visit.    ALLERGIES: No Known Allergies  FAMILY HISTORY: No family history on file.  Objective:  Blood pressure (!) 172/71, pulse 76, height 5\' 7"  (1.702 m),  weight 194 lb (88 kg), SpO2 96 %. General: No acute distress.  Patient appears well-groomed.   Head:  Normocephalic/atraumatic Eyes:  fundi examined but not visualized Neck: supple, no paraspinal tenderness, full range of motion Back: No paraspinal tenderness Heart: regular rate and rhythm Lungs: Clear to auscultation bilaterally. Vascular: No carotid  bruits. Neurological Exam: Mental status: alert and oriented to person, place, and time, speech fluent and not dysarthric, language intact. Cranial nerves: CN I: not tested CN II: pupils equal, round and reactive to light, visual fields intact CN III, IV, VI:  full range of motion, no nystagmus, no ptosis CN V: facial sensation intact. CN VII: upper and lower face symmetric CN VIII: hearing intact CN IX, X: gag intact, uvula midline CN XI: sternocleidomastoid and trapezius muscles intact CN XII: tongue midline Bulk & Tone: normal, no fasciculations. Motor:  muscle strength 5/5 throughout Sensation:  Pinprick, temperature and vibratory sensation intact. Deep Tendon Reflexes:  2+ throughout,  toes downgoing.   Finger to nose testing:  Without dysmetria.   Heel to shin:  Without dysmetria.   Gait:  Normal station and stride.  Romberg negative.    Thank you for allowing me to take part in the care of this patient.  Craig Millet, DO

## 2021-10-20 LAB — VITAMIN B12: Vitamin B-12: 1214 pg/mL (ref 232–1245)

## 2021-10-20 LAB — TSH: TSH: 1.16 u[IU]/mL (ref 0.450–4.500)

## 2021-10-27 ENCOUNTER — Telehealth: Payer: Self-pay | Admitting: Neurology

## 2021-10-27 ENCOUNTER — Encounter: Payer: Self-pay | Admitting: Physical Medicine & Rehabilitation

## 2021-10-27 NOTE — Telephone Encounter (Signed)
LMOVM to call the office back.

## 2021-10-27 NOTE — Telephone Encounter (Signed)
Patients son called about his dads referral to Leanord Hawking for neuropsychological evaluation at [facility]. Would like a call back

## 2021-10-28 NOTE — Telephone Encounter (Signed)
LMOVM for New Patient referral Please pt son back in regards to the referral sent over.   Advised pt son referral sent over patient is waiting on the office to call him.  Dr. Darl Pikes, PhD, ABPP (CN) [id number], [place], [place] [address] Phone: [phone]

## 2021-11-24 ENCOUNTER — Other Ambulatory Visit: Payer: No Typology Code available for payment source

## 2021-12-06 ENCOUNTER — Other Ambulatory Visit: Payer: No Typology Code available for payment source

## 2021-12-07 ENCOUNTER — Other Ambulatory Visit: Payer: Self-pay | Admitting: Psychiatry

## 2021-12-07 DIAGNOSIS — G3184 Mild cognitive impairment, so stated: Secondary | ICD-10-CM

## 2021-12-07 DIAGNOSIS — F444 Conversion disorder with motor symptom or deficit: Secondary | ICD-10-CM

## 2021-12-13 ENCOUNTER — Ambulatory Visit (INDEPENDENT_AMBULATORY_CARE_PROVIDER_SITE_OTHER): Payer: 59

## 2021-12-13 DIAGNOSIS — G3184 Mild cognitive impairment, so stated: Secondary | ICD-10-CM | POA: Diagnosis not present

## 2021-12-13 DIAGNOSIS — R519 Headache, unspecified: Secondary | ICD-10-CM | POA: Diagnosis not present

## 2021-12-13 DIAGNOSIS — M542 Cervicalgia: Secondary | ICD-10-CM

## 2021-12-13 DIAGNOSIS — R202 Paresthesia of skin: Secondary | ICD-10-CM

## 2021-12-13 DIAGNOSIS — F444 Conversion disorder with motor symptom or deficit: Secondary | ICD-10-CM | POA: Diagnosis not present

## 2021-12-13 MED ORDER — GADOBUTROL 1 MMOL/ML IV SOLN
9.0000 mL | Freq: Once | INTRAVENOUS | Status: AC | PRN
  Administered 2021-12-13: 9 mL via INTRAVENOUS

## 2021-12-14 ENCOUNTER — Ambulatory Visit: Payer: No Typology Code available for payment source | Admitting: Emergency Medicine

## 2021-12-23 ENCOUNTER — Encounter: Payer: 59 | Admitting: Physical Medicine & Rehabilitation

## 2022-02-08 ENCOUNTER — Encounter: Payer: 59 | Attending: Physical Medicine & Rehabilitation | Admitting: Physical Medicine & Rehabilitation

## 2023-05-09 NOTE — Progress Notes (Unsigned)
   Rubin Payor, PhD, LAT, ATC acting as a scribe for Craig Graham, MD.  Craig Jones is a 62 y.o. male who presents to [facility] at [facility] today for back pain x ***. Pt locates pain to ***  Radiating pain: LE numbness/tingling: LE weakness: Aggravates: Treatments tried:  Dx imaging: 08/01/21 L-spine, Ab/Pelvis, T-spine, & C-spine CT  Pertinent review of systems: ***  Relevant historical information: ***   Exam:  There were no vitals taken for this visit. General: Well Developed, well nourished, and in no acute distress.   MSK: ***    Lab and Radiology Results No results found for this or any previous visit (from the past 72 hour(s)). No results found.     Assessment and Plan: 62 y.o. male with ***   PDMP not reviewed this encounter. No orders of the defined types were placed in this encounter.  No orders of the defined types were placed in this encounter.    Discussed warning signs or symptoms. Please see discharge instructions. Patient expresses understanding.   ***

## 2023-05-10 ENCOUNTER — Ambulatory Visit (INDEPENDENT_AMBULATORY_CARE_PROVIDER_SITE_OTHER): Payer: Medicaid Other

## 2023-05-10 ENCOUNTER — Ambulatory Visit (INDEPENDENT_AMBULATORY_CARE_PROVIDER_SITE_OTHER): Payer: Medicaid Other | Admitting: Family Medicine

## 2023-05-10 ENCOUNTER — Encounter: Payer: Self-pay | Admitting: Family Medicine

## 2023-05-10 VITALS — BP 132/78 | HR 61 | Ht 67.0 in | Wt 181.0 lb

## 2023-05-10 DIAGNOSIS — M791 Myalgia, unspecified site: Secondary | ICD-10-CM

## 2023-05-10 DIAGNOSIS — G8929 Other chronic pain: Secondary | ICD-10-CM

## 2023-05-10 DIAGNOSIS — M5442 Lumbago with sciatica, left side: Secondary | ICD-10-CM | POA: Diagnosis not present

## 2023-05-10 DIAGNOSIS — M542 Cervicalgia: Secondary | ICD-10-CM

## 2023-05-10 DIAGNOSIS — R29898 Other symptoms and signs involving the musculoskeletal system: Secondary | ICD-10-CM | POA: Diagnosis not present

## 2023-05-10 DIAGNOSIS — M5441 Lumbago with sciatica, right side: Secondary | ICD-10-CM | POA: Diagnosis not present

## 2023-05-10 DIAGNOSIS — M2550 Pain in unspecified joint: Secondary | ICD-10-CM | POA: Diagnosis not present

## 2023-05-10 NOTE — Patient Instructions (Addendum)
Thank you for coming in today.   I've referred you to Physical Therapy.  Let us know if you don't hear from them in one week.   Please get an Xray today before you leave   Please get labs today before you leave   You should hear from MRI scheduling within 1 week. If you do not hear please let me know.    Check back in 3 weeks

## 2023-05-10 NOTE — Progress Notes (Signed)
Cervical spine x-ray shows some mild arthritis.  Lumbar spine x-ray is still pending.  Labs are still pending.

## 2023-05-10 NOTE — Progress Notes (Signed)
Lumbar spine x-ray shows some mild arthritis.  Labs are still pending.

## 2023-05-12 ENCOUNTER — Telehealth: Payer: Self-pay

## 2023-05-12 NOTE — Telephone Encounter (Signed)
Received a fax in regard to MRI's ordered for patient. They are needing more information in order to process the cases. We need to provide failure of 6 weeks of provider guided conservative treatment, with in the past 12 weeks, followed by re-evaluation, including duration, start dates, and end dates/if still on going related to the MRI's ordered.    I supplied what info we have but I do not have anything else to provide, this case will be denied.

## 2023-05-16 NOTE — Telephone Encounter (Signed)
I received an approval for the lumbar spin, but not the cervical spine. Looks like that is the one that will be denied

## 2023-05-17 NOTE — Telephone Encounter (Signed)
Please advise patient that we may be able to get the low back MRI completed but he is going to have to do physical therapy to get the cervical spine completed.  I did warn him at the time of the visit that this was a possibility.

## 2023-05-18 ENCOUNTER — Ambulatory Visit
Admission: RE | Admit: 2023-05-18 | Discharge: 2023-05-18 | Disposition: A | Discharge status: Home / Self Care | Payer: Medicaid Other | Source: Ambulatory Visit | Attending: Family Medicine | Admitting: Family Medicine

## 2023-05-18 DIAGNOSIS — G8929 Other chronic pain: Secondary | ICD-10-CM

## 2023-05-18 DIAGNOSIS — R29898 Other symptoms and signs involving the musculoskeletal system: Secondary | ICD-10-CM

## 2023-05-18 DIAGNOSIS — M542 Cervicalgia: Secondary | ICD-10-CM

## 2023-05-18 NOTE — Telephone Encounter (Signed)
Called pt with Mardelle Matte from interpreter services. Andy left VM for pt to call the office.

## 2023-05-25 ENCOUNTER — Ambulatory Visit: Payer: Medicaid Other | Attending: Family Medicine

## 2023-05-25 DIAGNOSIS — R2689 Other abnormalities of gait and mobility: Secondary | ICD-10-CM | POA: Diagnosis present

## 2023-05-25 DIAGNOSIS — M5442 Lumbago with sciatica, left side: Secondary | ICD-10-CM | POA: Insufficient documentation

## 2023-05-25 DIAGNOSIS — M5416 Radiculopathy, lumbar region: Secondary | ICD-10-CM | POA: Diagnosis present

## 2023-05-25 DIAGNOSIS — G8929 Other chronic pain: Secondary | ICD-10-CM | POA: Diagnosis not present

## 2023-05-25 DIAGNOSIS — M62830 Muscle spasm of back: Secondary | ICD-10-CM | POA: Insufficient documentation

## 2023-05-25 DIAGNOSIS — M5441 Lumbago with sciatica, right side: Secondary | ICD-10-CM | POA: Diagnosis not present

## 2023-05-25 DIAGNOSIS — M542 Cervicalgia: Secondary | ICD-10-CM | POA: Insufficient documentation

## 2023-05-25 NOTE — Therapy (Signed)
OUTPATIENT PHYSICAL THERAPY THORACOLUMBAR EVALUATION   Patient Name: Craig Jones MRN: [id number] DOB:11-15-1960, 62 y.o., male Today's Date: 05/25/2023  END OF SESSION:  PT End of Session - 05/25/23 1455     Visit Number 1    Date for PT Re-Evaluation 08/17/23    Authorization Type [place] Medicaid    PT Start Time 1455    PT Stop Time 1540    PT Time Calculation (min) 45 min             Past Medical History:  Diagnosis Date   Chest pain    Hypertension    Sleep disturbance    Past Surgical History:  Procedure Laterality Date   CARDIAC SURGERY  2018   Patient Active Problem List   Diagnosis Date Noted   OSA on CPAP 08/27/2019   Chronic bilateral low back pain without sciatica 11/05/2018   Coronary artery disease involving native coronary artery of native heart without angina pectoris 10/26/2018   Essential hypertension 10/26/2018   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   Fatty liver 05/31/2016   Elevated fasting glucose 12/10/2015   Hyperlipidemia, unspecified 10/14/2015    PCP: Dennis Bast  REFERRING PROVIDER: [name]  REFERRING DIAG:  M54.2 (ICD-10-CM) - Cervicalgia  M54.42,M54.41,G89.29 (ICD-10-CM) - Chronic bilateral low back pain with bilateral sciatica    Rationale for Evaluation and Treatment: Rehabilitation  THERAPY DIAG:  Radiculopathy, lumbar region  Cervicalgia  Muscle spasm of back  Other abnormalities of gait and mobility  ONSET DATE:   SUBJECTIVE:                                                                                                                                                                                           SUBJECTIVE STATEMENT: I have back problem and in my hip bones and in my knees. It starts at my head and goes all the way down my body. I has a procedure done last year in May.   PERTINENT HISTORY:  Craig Jones is a 62 y.o. male who presents to [facility] at [facility] today for back pain worsening over  the past 6 months. Pt locates pain to the neck, mid-back, lower back, bilateral legs, bilateral knees, left foot/ankle, and bilateral arms. Radicular sx present in B UE. Denies radicular sx in B LE. LE sx worse with bending, laying, sitting, using the bathroom. UE sx worse when wiping after rushing the bathroom. Grip strength remains in tact. Denies weakness in B UE. Sx progressively worsening. Feels that Gabapentin or Cymbalta are causing insomnia.    PAIN:  Are you having pain? Yes: NPRS scale: 10/10  Pain location: low back Pain description: dull, constant, achy Aggravating factors: walking, when not taking medicine, squats   Relieving factors: medicine   PRECAUTIONS: None  RED FLAGS: None   WEIGHT BEARING RESTRICTIONS: No  FALLS:  Has patient fallen in last 6 months? No  LIVING ENVIRONMENT: Lives with: lives with their family Lives in: House/apartment  OCCUPATION: work in Airline pilot, walks a lot   PLOF: Independent  PATIENT GOALS: get rid of pain   NEXT MD VISIT: 06/05/23  OBJECTIVE:   DIAGNOSTIC FINDINGS:  There is no evidence of lumbar spine fracture. Minimal curvature of spine. Mild degenerative joint changes with multilevel facet joint   There is no evidence of cervical spine fracture or prevertebral soft tissue swelling. Alignment is normal. Mild anterior spurring    COGNITION: Overall cognitive status: Within functional limits for tasks assessed     SENSATION: WFL  MUSCLE LENGTH: Hamstrings: tightness in BLE   POSTURE: forward head and decreased lumbar lordosis  PALPATION: Some tightness in bilateral upper traps, TTP both SIJ   CERVICAL ROM:   AROM eval  Flexion WFL  Extension WFL pain at end range  Right lateral flexion 25% with pain  Left lateral flexion 25% with pain  Right rotation 50%  Left rotation 50%     (Blank rows = not tested)  LUMBAR ROM:   AROM eval  Flexion Just past knees with pain   Extension 25% movement with pain  Right  lateral flexion Mid thigh with pain  Left lateral flexion Mid thigh with pain  Right rotation WFL  Left rotation WFL    (Blank rows = not tested)  LOWER EXTREMITY ROM:   grossly WFL, some limitations in knee flexion on R side   LOWER EXTREMITY MMT:  grossly 5/5   LUMBAR SPECIAL TESTS:  Straight leg raise test: Positive and FABER test: Positive  FUNCTIONAL TESTS:  5 times sit to stand: 24s with pain Timed up and go (TUG): 8.91s   GAIT: Distance walked: in clinic distances  Assistive device utilized: None Level of assistance: Modified independence Comments: antalgic gait pattern  TODAY'S TREATMENT:                                                                                                                              DATE: EVAL 05/25/23    PATIENT EDUCATION:  Education details: POC and HEP Person educated: Patient Education method: Explanation Education comprehension: verbalized understanding  HOME EXERCISE PROGRAM: Access Code: [email or web address] URL: [email or web address] Date: 05/25/2023 Prepared by: Cassie Freer  Exercises - Supine Lower Trunk Rotation  - 1 x daily - 7 x weekly - 2 sets - 10 reps - Supine Single Knee to Chest Stretch  - 1 x daily - 7 x weekly - 2 reps - 30 hold - Supine Piriformis Stretch with Foot on Ground  - 1 x daily - 7 x weekly - 2 reps - 30 hold - Supine Figure 4 Piriformis Stretch  -  1 x daily - 7 x weekly - 2 reps - 30 hold - Supine Bridge  - 1 x daily - 7 x weekly - 2 sets - 10 reps - Seated Cervical Sidebending Stretch  - 1 x daily - 7 x weekly - 2 reps - 30 hold - Doorway Pec Stretch at 90 Degrees Abduction  - 1 x daily - 7 x weekly - 2 reps - 30 hold  ASSESSMENT:  CLINICAL IMPRESSION: Patient is a 62 y.o. male who was seen today for physical therapy evaluation and treatment for low back, hip and bilateral knee pain. He presents with limited lumbar mobility and pain with most movements. He is very tight in lumbar paraspinals and  in lower extremities.  He also has neck pain and has ROM limitations. Patient walks with an antalgic gait and reports he takes a lot of medication to help with his pain. He will benefit from skilled PT to address his pain and increase his activity tolerance to be able to do his work and home tasks without difficulty.   OBJECTIVE IMPAIRMENTS: Abnormal gait, difficulty walking, decreased ROM, impaired flexibility, and pain.   ACTIVITY LIMITATIONS: squatting, stairs, and locomotion level  PARTICIPATION LIMITATIONS: community activity, occupation, and yard work  [facility] POTENTIAL: Good  CLINICAL DECISION MAKING: Stable/uncomplicated  EVALUATION COMPLEXITY: Low  GOALS: Goals reviewed with patient? Yes  SHORT TERM GOALS: Target date: 07/06/23  Patient will be independent with initial HEP.  Goal status: INITIAL   LONG TERM GOALS: Target date: 08/17/23  Patient will be independent with advanced/ongoing HEP to improve outcomes and carryover.  Goal status: INITIAL  2.  Patient will report 75% improvement in low back and neck pain to improve QOL.  Goal status: INITIAL  3.  Patient will demonstrate full pain free lumbar ROM to perform ADLs.   Baseline: see chart Goal status: INITIAL  4.  Patient will demonstrate full pain free cervical ROM to perform ADLs.  Baseline: see chart Goal status: INITIAL  5.  Patient will tolerate 1 hours of walking with more normalized gait pattern.  Baseline: antalgic gait unable to walk far distances Goal status: INITIAL PLAN:  PT FREQUENCY: 2x/week  PT DURATION: 12 weeks  PLANNED INTERVENTIONS: Therapeutic exercises, Therapeutic activity, Neuromuscular re-education, Balance training, Gait training, Patient/Family education, Self Care, Joint mobilization, Stair training, Dry Needling, Electrical stimulation, Spinal manipulation, Spinal mobilization, Cryotherapy, Moist heat, Taping, Ionotophoresis 4mg /ml Dexamethasone, and Manual therapy.  PLAN FOR NEXT  SESSION: stretching for low back and neck, functional strengthening    Cassie Freer, PT 05/25/2023, 3:51 PM

## 2023-06-02 ENCOUNTER — Ambulatory Visit: Payer: Medicaid Other | Admitting: Physical Therapy

## 2023-06-02 DIAGNOSIS — M5416 Radiculopathy, lumbar region: Secondary | ICD-10-CM | POA: Diagnosis not present

## 2023-06-02 DIAGNOSIS — R2689 Other abnormalities of gait and mobility: Secondary | ICD-10-CM

## 2023-06-02 DIAGNOSIS — M62830 Muscle spasm of back: Secondary | ICD-10-CM

## 2023-06-02 DIAGNOSIS — M542 Cervicalgia: Secondary | ICD-10-CM

## 2023-06-02 NOTE — Therapy (Signed)
Knee to Chest Stretch  - 1 x daily - 7 x weekly - 2 reps - 30 hold - Supine Piriformis Stretch with Foot on Ground  - 1 x daily - 7 x weekly - 2 reps - 30 hold - Supine Figure 4 Piriformis Stretch  - 1 x daily - 7 x weekly - 2 reps - 30 hold - Supine Bridge  - 1 x daily - 7 x weekly - 2 sets - 10 reps - Seated Cervical Sidebending Stretch  - 1 x daily - 7 x weekly - 2 reps - 30 hold - Doorway Pec Stretch at 90 Degrees Abduction  - 1 x daily - 7 x weekly - 2 reps - 30  hold  ASSESSMENT:  CLINICAL IMPRESSION: Pt tolerated initial progression of there ex and stretching well. Cuing provided for correct muscle activation with ex OBJECTIVE IMPAIRMENTS: Abnormal gait, difficulty walking, decreased ROM, impaired flexibility, and pain.   ACTIVITY LIMITATIONS: squatting, stairs, and locomotion level  PARTICIPATION LIMITATIONS: community activity, occupation, and yard work  [facility] POTENTIAL: Good  CLINICAL DECISION MAKING: Stable/uncomplicated  EVALUATION COMPLEXITY: Low  GOALS: Goals reviewed with patient? Yes  SHORT TERM GOALS: Target date: 07/06/23  Patient will be independent with initial HEP.  Goal status: INITIAL   LONG TERM GOALS: Target date: 08/17/23  Patient will be independent with advanced/ongoing HEP to improve outcomes and carryover.  Goal status: INITIAL  2.  Patient will report 75% improvement in low back and neck pain to improve QOL.  Goal status: INITIAL  3.  Patient will demonstrate full pain free lumbar ROM to perform ADLs.   Baseline: see chart Goal status: INITIAL  4.  Patient will demonstrate full pain free cervical ROM to perform ADLs.  Baseline: see chart Goal status: INITIAL  5.  Patient will tolerate 1 hours of walking with more normalized gait pattern.  Baseline: antalgic gait unable to walk far distances Goal status: INITIAL PLAN:  PT FREQUENCY: 2x/week  PT DURATION: 12 weeks  PLANNED INTERVENTIONS: Therapeutic exercises, Therapeutic activity, Neuromuscular re-education, Balance training, Gait training, Patient/Family education, Self Care, Joint mobilization, Stair training, Dry Needling, Electrical stimulation, Spinal manipulation, Spinal mobilization, Cryotherapy, Moist heat, Taping, Ionotophoresis 4mg /ml Dexamethasone, and Manual therapy.  PLAN FOR NEXT SESSION: stretching for low back and neck, functional strengthening    Buna Cuppett,ANGIE, PTA 06/02/2023, 8:31 AM [facility]  Rehabilitation at [facility]. [place], [place], [address] Phone: [phone]   Fax:  [phone]  Patient Details  Name: Craig Jones MRN: [id number] Date of Birth: 17-Oct-1960 Referring Provider:  Andreas Blower., MD  Encounter Date: 06/02/2023   Suanne Marker, PTA 06/02/2023, 8:31 AM  [place] [facility] at [facility] [address]. [place], [place], [address] Phone: [phone]   Fax:  [phone]  OUTPATIENT PHYSICAL THERAPY THORACOLUMBAR    Patient Name: Craig Jones MRN: [id number] DOB:14-Apr-1961, 62 y.o., male Today's Date: 06/02/2023  END OF SESSION:  PT End of Session - 06/02/23 0831     Visit Number 2    Date for PT Re-Evaluation 08/17/23    Authorization Type [place] Medicaid    PT Start Time 0835    PT Stop Time 0920    PT Time Calculation (min) 45 min             Past Medical History:  Diagnosis Date   Chest pain    Hypertension    Sleep disturbance    Past Surgical History:  Procedure Laterality Date   CARDIAC SURGERY  2018   Patient Active Problem List   Diagnosis Date Noted   OSA on CPAP 08/27/2019   Chronic bilateral low back pain without sciatica 11/05/2018   Coronary artery disease involving native coronary artery of native heart without angina pectoris 10/26/2018   Essential hypertension 10/26/2018   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   Fatty liver 05/31/2016   Elevated fasting glucose 12/10/2015   Hyperlipidemia, unspecified 10/14/2015    PCP: Dennis Bast  REFERRING PROVIDER: [name]  REFERRING DIAG:  M54.2 (ICD-10-CM) - Cervicalgia  M54.42,M54.41,G89.29 (ICD-10-CM) - Chronic bilateral low back pain with bilateral sciatica    Rationale for Evaluation and Treatment: Rehabilitation  THERAPY DIAG:  Radiculopathy, lumbar region  Cervicalgia  Muscle spasm of back  Other abnormalities of gait and mobility  ONSET DATE:   SUBJECTIVE:                                                                                                                                                                                           SUBJECTIVE STATEMENT: pt arrives with interpreter . States doing HEP without issues, minimal relief. IPERTINENT HISTORY:  Craig Jones is a 62 y.o. male who presents to [facility] at [facility] today for back pain worsening over the past 6 months. Pt locates pain to the neck, mid-back, lower back, bilateral legs,  bilateral knees, left foot/ankle, and bilateral arms. Radicular sx present in B UE. Denies radicular sx in B LE. LE sx worse with bending, laying, sitting, using the bathroom. UE sx worse when wiping after rushing the bathroom. Grip strength remains in tact. Denies weakness in B UE. Sx progressively worsening. Feels that Gabapentin or Cymbalta are causing insomnia.    PAIN:  Are you having pain? Yes: NPRS scale: 6-7/10 Pain location: low back Pain description: dull, constant, achy Aggravating factors: walking, when not taking medicine, squats   Relieving factors: medicine   PRECAUTIONS:  Knee to Chest Stretch  - 1 x daily - 7 x weekly - 2 reps - 30 hold - Supine Piriformis Stretch with Foot on Ground  - 1 x daily - 7 x weekly - 2 reps - 30 hold - Supine Figure 4 Piriformis Stretch  - 1 x daily - 7 x weekly - 2 reps - 30 hold - Supine Bridge  - 1 x daily - 7 x weekly - 2 sets - 10 reps - Seated Cervical Sidebending Stretch  - 1 x daily - 7 x weekly - 2 reps - 30 hold - Doorway Pec Stretch at 90 Degrees Abduction  - 1 x daily - 7 x weekly - 2 reps - 30  hold  ASSESSMENT:  CLINICAL IMPRESSION: Pt tolerated initial progression of there ex and stretching well. Cuing provided for correct muscle activation with ex OBJECTIVE IMPAIRMENTS: Abnormal gait, difficulty walking, decreased ROM, impaired flexibility, and pain.   ACTIVITY LIMITATIONS: squatting, stairs, and locomotion level  PARTICIPATION LIMITATIONS: community activity, occupation, and yard work  [facility] POTENTIAL: Good  CLINICAL DECISION MAKING: Stable/uncomplicated  EVALUATION COMPLEXITY: Low  GOALS: Goals reviewed with patient? Yes  SHORT TERM GOALS: Target date: 07/06/23  Patient will be independent with initial HEP.  Goal status: INITIAL   LONG TERM GOALS: Target date: 08/17/23  Patient will be independent with advanced/ongoing HEP to improve outcomes and carryover.  Goal status: INITIAL  2.  Patient will report 75% improvement in low back and neck pain to improve QOL.  Goal status: INITIAL  3.  Patient will demonstrate full pain free lumbar ROM to perform ADLs.   Baseline: see chart Goal status: INITIAL  4.  Patient will demonstrate full pain free cervical ROM to perform ADLs.  Baseline: see chart Goal status: INITIAL  5.  Patient will tolerate 1 hours of walking with more normalized gait pattern.  Baseline: antalgic gait unable to walk far distances Goal status: INITIAL PLAN:  PT FREQUENCY: 2x/week  PT DURATION: 12 weeks  PLANNED INTERVENTIONS: Therapeutic exercises, Therapeutic activity, Neuromuscular re-education, Balance training, Gait training, Patient/Family education, Self Care, Joint mobilization, Stair training, Dry Needling, Electrical stimulation, Spinal manipulation, Spinal mobilization, Cryotherapy, Moist heat, Taping, Ionotophoresis 4mg /ml Dexamethasone, and Manual therapy.  PLAN FOR NEXT SESSION: stretching for low back and neck, functional strengthening    Buna Cuppett,ANGIE, PTA 06/02/2023, 8:31 AM [facility]  Rehabilitation at [facility]. [place], [place], [address] Phone: [phone]   Fax:  [phone]  Patient Details  Name: Craig Jones MRN: [id number] Date of Birth: 17-Oct-1960 Referring Provider:  Andreas Blower., MD  Encounter Date: 06/02/2023   Suanne Marker, PTA 06/02/2023, 8:31 AM  [place] [facility] at [facility] [address] [place], [place], [address] Phone: [phone]   Fax:  [phone]

## 2023-06-05 ENCOUNTER — Ambulatory Visit (INDEPENDENT_AMBULATORY_CARE_PROVIDER_SITE_OTHER): Payer: Medicaid Other | Admitting: Family Medicine

## 2023-06-05 ENCOUNTER — Other Ambulatory Visit: Payer: Self-pay

## 2023-06-05 ENCOUNTER — Encounter: Payer: Self-pay | Admitting: Family Medicine

## 2023-06-05 VITALS — BP 122/80 | HR 63 | Ht 67.0 in | Wt 173.8 lb

## 2023-06-05 DIAGNOSIS — M542 Cervicalgia: Secondary | ICD-10-CM | POA: Diagnosis not present

## 2023-06-05 DIAGNOSIS — M533 Sacrococcygeal disorders, not elsewhere classified: Secondary | ICD-10-CM

## 2023-06-05 DIAGNOSIS — G8929 Other chronic pain: Secondary | ICD-10-CM

## 2023-06-05 DIAGNOSIS — M545 Low back pain, unspecified: Secondary | ICD-10-CM

## 2023-06-05 NOTE — Progress Notes (Signed)
I, Stevenson Clinch, CMA acting as a scribe for Craig Graham, MD.  Craig Jones is a 62 y.o. male who presents to [facility] at [facility] today for for myalgia, chronic neck and back pain,  w/ MRI review. Pt was last seen by Dr. Denyse Amass on 05/10/23 and he was referred to PT, a rheum work up was obtained, he was advised to pause on his rosuvastatin.  Today, pt reports PT visits 1-2 x weekly, has been x 3 visits. Has stopped statin. Notes improvement of sx with PT and discontinuation of statin. Today notes some pain in the right knee and the bottom of the left foot. Continues to have some pain in the neck and back, though less than before. Takes Aleve daily with some relief.   He notes his overall myalgias have improved considerably after stopping rosuvastatin.  He is scheduled to follow-up with his primary care provider in November.  Dx testing: 05/18/23 L-spine & C-spine MRI  05/10/23 L-spine & C-spine XR and labs 08/01/21 L-spine, Ab/Pelvis, T-spine, & C-spine CT   Pertinent review of systems: No fevers or chills  Relevant historical information: Coronary artery disease and hyperlipidemia.   Exam:  BP 122/80   Pulse 63   Ht 5\' 7"  (1.702 m)   Wt 173 lb 12.8 oz (78.8 kg)   SpO2 97%   BMI 27.22 kg/m  General: Well Developed, well nourished, and in no acute distress.   MSK: C-spine decreased cervical motion.  Upper extremity strength is intact.  Pain with shoulder abduction bilaterally.  L-spine nontender decreased lumbar motion.  Tender palpation right lateral hip.    Lab and Radiology Results   EXAM: MRI LUMBAR SPINE WITHOUT CONTRAST   TECHNIQUE: Multiplanar, multisequence MR imaging of the lumbar spine was performed. No intravenous contrast was administered.   COMPARISON:  Lumbar spine radiographs 05/10/2023. Lumbar spine CT 08/01/2021.   FINDINGS: Segmentation:  Standard.   Alignment:  Trace retrolisthesis of L2 on L3.   Vertebrae: No lumbar spine  fracture. Multiple Schmorl's nodes as shown on the prior CT, with mild edema noted adjacent to Schmorl's nodes involving the L3 and L4 superior endplates. Mild endplate edema anteriorly at L5-S1, likely degenerative. Partially visualized marrow STIR hyperintensity/edema in the left lateral aspect of the sacrum at S2-3, not included on axial images and only partially covered on sagittal images.   Conus medullaris and cauda equina: Conus extends to the upper L2 level. Conus and cauda equina appear normal.   Paraspinal and other soft tissues: Unremarkable.   Disc levels:   Disc desiccation throughout the lumbar spine. Moderate disc space narrowing from L1-2 through L3-4 and mild narrowing at L4-5 and L5-S1.   T12-L1: Negative.   L1-2: Mild disc bulging without stenosis.   L2-3: Disc bulging results in mild right neural foraminal stenosis without spinal stenosis.   L3-4: Disc bulging, a left foraminal to extraforaminal disc osteophyte complex, and mild facet hypertrophy result in mild right and moderate left neural foraminal stenosis without spinal stenosis.   L4-5: Disc bulging and mild facet hypertrophy result in mild right and mild-to-moderate left neural foraminal stenosis without spinal stenosis.   L5-S1: Disc bulging and mild facet hypertrophy result in mild bilateral neural foraminal stenosis without spinal stenosis.   IMPRESSION: 1. Partially visualized signal abnormality in the left sacrum at S2-3, possibly edema from a recent fracture versus an inflammatory or neoplastic process. Recommend further evaluation with pelvic MRI without and with contrast. 2. Diffuse lumbar disc  degeneration without spinal stenosis. 3. Mild-to-moderate multilevel neural foraminal stenosis as above.     Electronically Signed   By: Sebastian Ache M.D.   On: 06/04/2023 12:52   EXAM: MRI CERVICAL SPINE WITHOUT CONTRAST   TECHNIQUE: Multiplanar, multisequence MR imaging of the cervical  spine was performed. No intravenous contrast was administered.   COMPARISON:  Cervical spine radiographs 05/10/2023. Cervical spine CT 08/01/2021.   FINDINGS: Alignment: Cervical spine straightening.  No significant listhesis.   Vertebrae: No fracture or suspicious marrow lesion. Multilevel degenerative endplate changes, greatest at C5-6 where there is low level degenerative edema.   Cord: Normal signal and morphology.   Posterior Fossa, vertebral arteries, paraspinal tissues: Unremarkable.   Disc levels:   C2-3: Negative.   C3-4: Disc bulging and uncovertebral spurring result in moderate right and mild left neural foraminal stenosis without spinal stenosis.   C4-5: Mild-to-moderate disc space narrowing. Disc bulging and uncovertebral spurring result in mild spinal stenosis and severe right and mild left neural foraminal stenosis.   C5-6: Moderate to severe disc space narrowing. Disc bulging and left greater than right uncovertebral spurring result in mild spinal stenosis and moderate right and severe left neural foraminal stenosis.   C6-7: Minimal disc bulging, asymmetric left uncovertebral spurring and mild facet arthrosis result in moderate left neural foraminal stenosis without spinal stenosis.   C7-T1: Negative.   IMPRESSION: 1. Multilevel cervical disc degeneration, worst at C5-6 where there is mild spinal stenosis and moderate right and severe left neural foraminal stenosis. 2. Mild spinal stenosis and severe right neural foraminal stenosis at C4-5. 3. Moderate neural foraminal stenosis on the right at C3-4 and on the left at C6-7.     Electronically Signed   By: Sebastian Ache M.D.   On: 06/04/2023 12:42  I, Craig Jones, personally (independently) visualized and performed the interpretation of the images attached in this note.  Assessment and Plan: 62 y.o. male with chronic neck and low back pain.  He has multilevel degenerative changes that could be  treated with physical therapy and with potentially injections.  He is just getting started with physical therapy now having had only 2 visits.  I think with further optimization with physical therapy he will feel a bit better and there will be fewer targets for injection.  He does have this abnormal appearance partially visualized in the sacrum with lumbar spine MRI.  Based on radiology recommendations I will image the sacrum specifically to evaluate the mass or abnormal appearance of bone.  Will proceed with noncontrast MRI of the sacrum.  Recheck after MRI results are back.   PDMP not reviewed this encounter. Orders Placed This Encounter  Procedures   [place] LIMITED JOINT SPACE STRUCTURES LOW RIGHT(NO LINKED CHARGES)    Order Specific Question:   Reason for Exam (SYMPTOM  OR DIAGNOSIS REQUIRED)    Answer:   right knee pain    Order Specific Question:   Preferred imaging location?    Answer:   [facility]   MR SACRUM SI JOINTS WO CONTRAST    Standing Status:   Future    Standing Expiration Date:   06/04/2024    Order Specific Question:   What is the patient's sedation requirement?    Answer:   No Sedation    Order Specific Question:   Does the patient have a pacemaker or implanted devices?    Answer:   No    Order Specific Question:   Preferred imaging location?  Answer:   [facility] (table limit-550lbs)   No orders of the defined types were placed in this encounter.    Discussed warning signs or symptoms. Please see discharge instructions. Patient expresses understanding.   The above documentation has been reviewed and is accurate and complete Craig Jones, M.D. Total encounter time 30 minutes including face-to-face time with the patient and, reviewing past medical record, and charting on the date of service.

## 2023-06-05 NOTE — Patient Instructions (Addendum)
Thank you for coming in today.  I am ordering a MRI of the sacrum.   Continue PT.   Recheck following the results of the MRI.

## 2023-06-07 ENCOUNTER — Encounter: Payer: Self-pay | Admitting: Physical Therapy

## 2023-06-07 ENCOUNTER — Ambulatory Visit: Payer: Medicaid Other | Admitting: Physical Therapy

## 2023-06-07 DIAGNOSIS — M5416 Radiculopathy, lumbar region: Secondary | ICD-10-CM | POA: Diagnosis not present

## 2023-06-07 DIAGNOSIS — M542 Cervicalgia: Secondary | ICD-10-CM

## 2023-06-07 DIAGNOSIS — M62830 Muscle spasm of back: Secondary | ICD-10-CM

## 2023-06-07 NOTE — Therapy (Signed)
OUTPATIENT PHYSICAL THERAPY THORACOLUMBAR    Patient Name: Craig Jones MRN: [id number] DOB:[birth year], 62 y.o., male Today's Date: 06/07/2023  END OF SESSION:  PT End of Session - 06/07/23 1344     Visit Number 3    Date for PT Re-Evaluation 08/17/23    PT Start Time 1345    PT Stop Time 1430    PT Time Calculation (min) 45 min    Activity Tolerance Patient tolerated treatment well    Behavior During Therapy [facility] for tasks assessed/performed             Past Medical History:  Diagnosis Date   Chest pain    Hypertension    Sleep disturbance    Past Surgical History:  Procedure Laterality Date   CARDIAC SURGERY  2018   Patient Active Problem List   Diagnosis Date Noted   OSA on CPAP 08/27/2019   Chronic bilateral low back pain without sciatica 11/05/2018   Coronary artery disease involving native coronary artery of native heart without angina pectoris 10/26/2018   Essential hypertension 10/26/2018   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   Fatty liver 05/31/2016   Elevated fasting glucose 12/10/2015   Hyperlipidemia, unspecified 10/14/2015    PCP: Dennis Bast  REFERRING PROVIDER: [name]  REFERRING DIAG:  M54.2 (ICD-10-CM) - Cervicalgia  M54.42,M54.41,G89.29 (ICD-10-CM) - Chronic bilateral low back pain with bilateral sciatica    Rationale for Evaluation and Treatment: Rehabilitation  THERAPY DIAG:  Radiculopathy, lumbar region  Cervicalgia  Muscle spasm of back  ONSET DATE:   SUBJECTIVE:                                                                                                                                                                                           SUBJECTIVE STATEMENT: "Good" IPERTINENT HISTORY:  Ichael Jones is a 62 y.o. male who presents to [facility] at [facility] today for back pain worsening over the past 6 months. Pt locates pain to the neck, mid-back, lower back, bilateral legs, bilateral knees, left foot/ankle,  and bilateral arms. Radicular sx present in B UE. Denies radicular sx in B LE. LE sx worse with bending, laying, sitting, using the bathroom. UE sx worse when wiping after rushing the bathroom. Grip strength remains in tact. Denies weakness in B UE. Sx progressively worsening. Feels that Gabapentin or Cymbalta are causing insomnia.    PAIN:  Are you having pain? Yes: NPRS scale: 3-4/10 Pain location: low back Pain description: dull, constant, achy Aggravating factors: walking, when not taking medicine, squats   Relieving factors: medicine   PRECAUTIONS: None  RED FLAGS: None  WEIGHT BEARING RESTRICTIONS: No  FALLS:  Has patient fallen in last 6 months? No  LIVING ENVIRONMENT: Lives with: lives with their family Lives in: House/apartment  OCCUPATION: work in Air[employer] pilot, walks a lot   PLOF: Independent  PATIENT GOALS: get rid of pain   NEXT MD VISIT: 06/05/23  OBJECTIVE:   DIAGNOSTIC FINDINGS:  There is no evidence of lumbar spine fracture. Minimal curvature of spine. Mild degenerative joint changes with multilevel facet joint   There is no evidence of cervical spine fracture or prevertebral soft tissue swelling. Alignment is normal. Mild anterior spurring    COGNITION: Overall cognitive status: Within functional limits for tasks assessed     SENSATION: WFL  MUSCLE LENGTH: Hamstrings: tightness in BLE   POSTURE: forward head and decreased lumbar lordosis  PALPATION: Some tightness in bilateral upper traps, TTP both SIJ   CERVICAL ROM:   AROM eval  Flexion WFL  Extension WFL pain at end range  Right lateral flexion 25% with pain  Left lateral flexion 25% with pain  Right rotation 50%  Left rotation 50%     (Blank rows = not tested)  LUMBAR ROM:   AROM eval  Flexion Just past knees with pain   Extension 25% movement with pain  Right lateral flexion Mid thigh with pain  Left lateral flexion Mid thigh with pain  Right rotation WFL  Left rotation WFL     (Blank rows = not tested)  LOWER EXTREMITY ROM:   grossly WFL, some limitations in knee flexion on R side   LOWER EXTREMITY MMT:  grossly 5/5   LUMBAR SPECIAL TESTS:  Straight leg raise test: Positive and FABER test: Positive  FUNCTIONAL TESTS:  5 times sit to stand: 24s with pain Timed up and go (TUG): 8.91s   GAIT: Distance walked: in clinic distances  Assistive device utilized: None Level of assistance: Modified independence Comments: antalgic gait pattern  TODAY'S TREATMENT:                                                                                                                              DATE:  06/06/24 Bike L2.5 4 min NuStep L 5 x 6 min Shoulder Ext 10lb 2x10 Seated Rows 25lb & Lats 35lb 2x10  Overhead Ext w/ yellow bal 2x10 S2S holding yellow ball  LE stretches piriformis, K2C, Lower trunk rotaion, ITB  06/02/23 Nustep L 5 5 min Supine feet on ball bridge,KTC, obl 10 x each Supine isometric abdominals 10 x hold 3 sec PROM LE and trunk Red tband hip bent knee flex, clams,SLR,SLR with ABD 10 each ADD ball squeeze 10 x STS with wt ball chest press 10 x Wt ball standing rotation 10 x each way Red tbnad standing shld ext and row 2 sets 10   EVAL 05/25/23    PATIENT EDUCATION:  Education details: POC and HEP Person educated: Patient Education method: Explanation Education comprehension: verbalized understanding  HOME EXERCISE PROGRAM: Access  Code: [email or web address] URL: [email or web address] Date: 05/25/2023 Prepared by: Cassie Freer  Exercises - Supine Lower Trunk Rotation  - 1 x daily - 7 x weekly - 2 sets - 10 reps - Supine Single Knee to Chest Stretch  - 1 x daily - 7 x weekly - 2 reps - 30 hold - Supine Piriformis Stretch with Foot on Ground  - 1 x daily - 7 x weekly - 2 reps - 30 hold - Supine Figure 4 Piriformis Stretch  - 1 x daily - 7 x weekly - 2 reps - 30 hold - Supine Bridge  - 1 x daily - 7 x weekly - 2 sets - 10 reps - Seated  Cervical Sidebending Stretch  - 1 x daily - 7 x weekly - 2 reps - 30 hold - Doorway Pec Stretch at 90 Degrees Abduction  - 1 x daily - 7 x weekly - 2 reps - 30 hold  ASSESSMENT:  CLINICAL IMPRESSION: Pt tolerated initial progressed therapy session. Cues needed to keep shoulder down and back with shoulder EXT. Some tightness in both piriformis muscles with stretching. Cuing provided for correct muscle activation with ex OBJECTIVE IMPAIRMENTS: Abnormal gait, difficulty walking, decreased ROM, impaired flexibility, and pain.   ACTIVITY LIMITATIONS: squatting, stairs, and locomotion level  PARTICIPATION LIMITATIONS: community activity, occupation, and yard work  [facility] POTENTIAL: Good  CLINICAL DECISION MAKING: Stable/uncomplicated  EVALUATION COMPLEXITY: Low  GOALS: Goals reviewed with patient? Yes  SHORT TERM GOALS: Target date: 07/06/23  Patient will be independent with initial HEP.  Goal status: INITIAL   LONG TERM GOALS: Target date: 08/17/23  Patient will be independent with advanced/ongoing HEP to improve outcomes and carryover.  Goal status: INITIAL  2.  Patient will report 75% improvement in low back and neck pain to improve QOL.  Goal status: INITIAL  3.  Patient will demonstrate full pain free lumbar ROM to perform ADLs.   Baseline: see chart Goal status: INITIAL  4.  Patient will demonstrate full pain free cervical ROM to perform ADLs.  Baseline: see chart Goal status: INITIAL  5.  Patient will tolerate 1 hours of walking with more normalized gait pattern.  Baseline: antalgic gait unable to walk far distances Goal status: INITIAL PLAN:  PT FREQUENCY: 2x/week  PT DURATION: 12 weeks  PLANNED INTERVENTIONS: Therapeutic exercises, Therapeutic activity, Neuromuscular re-education, Balance training, Gait training, Patient/Family education, Self Care, Joint mobilization, Stair training, Dry Needling, Electrical stimulation, Spinal manipulation, Spinal mobilization,  Cryotherapy, Moist heat, Taping, Ionotophoresis 4mg /ml Dexamethasone, and Manual therapy.  PLAN FOR NEXT SESSION: stretching for low back and neck, functional strengthening    Grayce Sessions, PTA

## 2023-06-12 ENCOUNTER — Ambulatory Visit: Payer: Medicaid Other | Admitting: Physical Therapy

## 2023-06-12 ENCOUNTER — Encounter: Payer: Self-pay | Admitting: Physical Therapy

## 2023-06-12 DIAGNOSIS — M62830 Muscle spasm of back: Secondary | ICD-10-CM

## 2023-06-12 DIAGNOSIS — M5416 Radiculopathy, lumbar region: Secondary | ICD-10-CM

## 2023-06-12 DIAGNOSIS — M542 Cervicalgia: Secondary | ICD-10-CM

## 2023-06-12 NOTE — Therapy (Addendum)
OUTPATIENT PHYSICAL THERAPY THORACOLUMBAR    Patient Name: Craig Jones MRN: [id number] DOB:[birth year], 62 y.o., male Today's Date: 06/12/2023  END OF SESSION:  PT End of Session - 06/12/23 0842     Visit Number 4    Date for PT Re-Evaluation 08/17/23    PT Start Time 0845    PT Stop Time 0930    PT Time Calculation (min) 45 min    Activity Tolerance Patient tolerated treatment well    Behavior During Therapy [name] for tasks assessed/performed             Past Medical History:  Diagnosis Date   Chest pain    Hypertension    Sleep disturbance    Past Surgical History:  Procedure Laterality Date   CARDIAC SURGERY  2018   Patient Active Problem List   Diagnosis Date Noted   OSA on CPAP 08/27/2019   Chronic bilateral low back pain without sciatica 11/05/2018   Coronary artery disease involving native coronary artery of native heart without angina pectoris 10/26/2018   Essential hypertension 10/26/2018   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   Fatty liver 05/31/2016   Elevated fasting glucose 12/10/2015   Hyperlipidemia, unspecified 10/14/2015    PCP: Dennis Bast  REFERRING PROVIDER: [name]  REFERRING DIAG:  M54.2 (ICD-10-CM) - Cervicalgia  M54.42,M54.41,G89.29 (ICD-10-CM) - Chronic bilateral low back pain with bilateral sciatica    Rationale for Evaluation and Treatment: Rehabilitation  THERAPY DIAG:  Radiculopathy, lumbar region  Cervicalgia  Muscle spasm of back  ONSET DATE:   SUBJECTIVE:                                                                                                                                                                                           SUBJECTIVE STATEMENT: Maybe a little foot pain. "Yesterday in the gum one hour" IPERTINENT HISTORY:  Craig Jones is a 62 y.o. male who presents to [facility] at [facility] today for back pain worsening over the past 6 months. Pt locates pain to the neck, mid-back, lower back,  bilateral legs, bilateral knees, left foot/ankle, and bilateral arms. Radicular sx present in B UE. Denies radicular sx in B LE. LE sx worse with bending, laying, sitting, using the bathroom. UE sx worse when wiping after rushing the bathroom. Grip strength remains in tact. Denies weakness in B UE. Sx progressively worsening. Feels that Gabapentin or Cymbalta are causing insomnia.    PAIN:  Are you having pain? Yes: NPRS scale: 2/10 Pain location: L foot Pain description: dull, constant, achy Aggravating factors: walking, when not taking medicine, squats   Relieving factors:  medicine   PRECAUTIONS: None  RED FLAGS: None   WEIGHT BEARING RESTRICTIONS: No  FALLS:  Has patient fallen in last 6 months? No  LIVING ENVIRONMENT: Lives with: lives with their family Lives in: House/apartment  OCCUPATION: work in Air[employer] pilot, walks a lot   PLOF: Independent  PATIENT GOALS: get rid of pain   NEXT MD VISIT: 06/05/23  OBJECTIVE:   DIAGNOSTIC FINDINGS:  There is no evidence of lumbar spine fracture. Minimal curvature of spine. Mild degenerative joint changes with multilevel facet joint   There is no evidence of cervical spine fracture or prevertebral soft tissue swelling. Alignment is normal. Mild anterior spurring    COGNITION: Overall cognitive status: Within functional limits for tasks assessed     SENSATION: WFL  MUSCLE LENGTH: Hamstrings: tightness in BLE   POSTURE: forward head and decreased lumbar lordosis  PALPATION: Some tightness in bilateral upper traps, TTP both SIJ   CERVICAL ROM:   AROM eval  Flexion WFL  Extension WFL pain at end range  Right lateral flexion 25% with pain  Left lateral flexion 25% with pain  Right rotation 50%  Left rotation 50%     (Blank rows = not tested)  LUMBAR ROM:   AROM eval  Flexion Just past knees with pain   Extension 25% movement with pain  Right lateral flexion Mid thigh with pain  Left lateral flexion Mid thigh with pain   Right rotation WFL  Left rotation WFL    (Blank rows = not tested)  LOWER EXTREMITY ROM:   grossly WFL, some limitations in knee flexion on R side   LOWER EXTREMITY MMT:  grossly 5/5   LUMBAR SPECIAL TESTS:  Straight leg raise test: Positive and FABER test: Positive  FUNCTIONAL TESTS:  5 times sit to stand: 24s with pain Timed up and go (TUG): 8.91s   GAIT: Distance walked: in clinic distances  Assistive device utilized: None Level of assistance: Modified independence Comments: antalgic gait pattern  TODAY'S TREATMENT:                                                                                                                              DATE:  06/12/23 NuStep L5 x 6 min HS Curls 25lb 2x12 Leg Ext 10lb 2x12 S2S OHP yellow ball 2x10  Shoulder Ext 10lb 2x12 Standing Rows 15lb 2x10 Supine bridges 2x10 LE on Pball bridges, oblq, K2c LE stretches piriformis, K2C, Lower trunk rotaion, ITB, glute  06/06/24 Bike L2.5 4 min NuStep L 5 x 6 min Shoulder Ext 10lb 2x10 Seated Rows 25lb & Lats 35lb 2x10  Overhead Ext w/ yellow bal 2x10 S2S holding yellow ball  LE stretches piriformis, K2C, Lower trunk rotaion, ITB  06/02/23 Nustep L 5 5 min Supine feet on ball bridge,KTC, obl 10 x each Supine isometric abdominals 10 x hold 3 sec PROM LE and trunk Red tband hip bent knee flex, clams,SLR,SLR with ABD 10 each ADD ball squeeze  10 x STS with wt ball chest press 10 x Wt ball standing rotation 10 x each way Red tbnad standing shld ext and row 2 sets 10   EVAL 05/25/23    PATIENT EDUCATION:  Education details: POC and HEP Person educated: Patient Education method: Explanation Education comprehension: verbalized understanding  HOME EXERCISE PROGRAM: Access Code: [email or web address] URL: [email or web address] Date: 05/25/2023 Prepared by: Cassie Freer  Exercises - Supine Lower Trunk Rotation  - 1 x daily - 7 x weekly - 2 sets - 10 reps - Supine Single Knee to Chest  Stretch  - 1 x daily - 7 x weekly - 2 reps - 30 hold - Supine Piriformis Stretch with Foot on Ground  - 1 x daily - 7 x weekly - 2 reps - 30 hold - Supine Figure 4 Piriformis Stretch  - 1 x daily - 7 x weekly - 2 reps - 30 hold - Supine Bridge  - 1 x daily - 7 x weekly - 2 sets - 10 reps - Seated Cervical Sidebending Stretch  - 1 x daily - 7 x weekly - 2 reps - 30 hold - Doorway Pec Stretch at 90 Degrees Abduction  - 1 x daily - 7 x weekly - 2 reps - 30 hold  ASSESSMENT:  CLINICAL IMPRESSION: Pt enters doing well with little R foot pain. Progressed with core streng and stability. Cue needed for full ROM with leg curls and extensions. Cues needed to keep shoulder down and back with shoulder EXT. Stretching to R glute did cause some pain in the area. Cuing provided for correct muscle activation with ex  OBJECTIVE IMPAIRMENTS: Abnormal gait, difficulty walking, decreased ROM, impaired flexibility, and pain.   ACTIVITY LIMITATIONS: squatting, stairs, and locomotion level  PARTICIPATION LIMITATIONS: community activity, occupation, and yard work  [facility] POTENTIAL: Good  CLINICAL DECISION MAKING: Stable/uncomplicated  EVALUATION COMPLEXITY: Low  GOALS: Goals reviewed with patient? Yes  SHORT TERM GOALS: Target date: 07/06/23  Patient will be independent with initial HEP.  Goal status: INITIAL   LONG TERM GOALS: Target date: 08/17/23  Patient will be independent with advanced/ongoing HEP to improve outcomes and carryover.  Goal status: INITIAL  2.  Patient will report 75% improvement in low back and neck pain to improve QOL.  Goal status: Progressing 06/12/23  3.  Patient will demonstrate full pain free lumbar ROM to perform ADLs.   Baseline: see chart Goal status: INITIAL  4.  Patient will demonstrate full pain free cervical ROM to perform ADLs.  Baseline: see chart Goal status: INITIAL  5.  Patient will tolerate 1 hours of walking with more normalized gait pattern.  Baseline:  antalgic gait unable to walk far distances Goal status: INITIAL PLAN:  PT FREQUENCY: 2x/week  PT DURATION: 12 weeks  PLANNED INTERVENTIONS: Therapeutic exercises, Therapeutic activity, Neuromuscular re-education, Balance training, Gait training, Patient/Family education, Self Care, Joint mobilization, Stair training, Dry Needling, Electrical stimulation, Spinal manipulation, Spinal mobilization, Cryotherapy, Moist heat, Taping, Ionotophoresis 4mg /ml Dexamethasone, and Manual therapy.  PLAN FOR NEXT SESSION: stretching for low back and neck, functional strengthening    Grayce Sessions, PTA

## 2023-06-14 ENCOUNTER — Telehealth: Payer: Self-pay | Admitting: Family Medicine

## 2023-06-14 DIAGNOSIS — M533 Sacrococcygeal disorders, not elsewhere classified: Secondary | ICD-10-CM

## 2023-06-14 NOTE — Telephone Encounter (Signed)
Changed non-contrast MRI to w/and wo per radiology.

## 2023-06-15 ENCOUNTER — Inpatient Hospital Stay: Admission: RE | Admit: 2023-06-15 | Payer: Medicaid Other | Source: Ambulatory Visit

## 2023-06-22 ENCOUNTER — Ambulatory Visit
Admission: RE | Admit: 2023-06-22 | Discharge: 2023-06-22 | Disposition: A | Discharge status: Home / Self Care | Payer: Medicaid Other | Source: Ambulatory Visit | Attending: Family Medicine | Admitting: Family Medicine

## 2023-06-22 DIAGNOSIS — M533 Sacrococcygeal disorders, not elsewhere classified: Secondary | ICD-10-CM

## 2023-06-22 MED ORDER — GADOPICLENOL 0.5 MMOL/ML IV SOLN
8.0000 mL | Freq: Once | INTRAVENOUS | Status: AC | PRN
  Administered 2023-06-22: 8 mL via INTRAVENOUS

## 2023-06-30 ENCOUNTER — Ambulatory Visit: Payer: Medicaid Other | Admitting: Family Medicine

## 2023-06-30 ENCOUNTER — Ambulatory Visit (INDEPENDENT_AMBULATORY_CARE_PROVIDER_SITE_OTHER): Payer: Medicaid Other

## 2023-06-30 ENCOUNTER — Other Ambulatory Visit: Payer: Self-pay

## 2023-06-30 VITALS — BP 138/82 | HR 59 | Ht 67.0 in | Wt 176.0 lb

## 2023-06-30 DIAGNOSIS — G8929 Other chronic pain: Secondary | ICD-10-CM

## 2023-06-30 DIAGNOSIS — M7062 Trochanteric bursitis, left hip: Secondary | ICD-10-CM

## 2023-06-30 DIAGNOSIS — M791 Myalgia, unspecified site: Secondary | ICD-10-CM

## 2023-06-30 DIAGNOSIS — M25561 Pain in right knee: Secondary | ICD-10-CM

## 2023-06-30 DIAGNOSIS — M79672 Pain in left foot: Secondary | ICD-10-CM

## 2023-06-30 DIAGNOSIS — M2550 Pain in unspecified joint: Secondary | ICD-10-CM

## 2023-06-30 DIAGNOSIS — M7061 Trochanteric bursitis, right hip: Secondary | ICD-10-CM

## 2023-06-30 LAB — SEDIMENTATION RATE: Sed Rate: 43 mm/h — ABNORMAL HIGH (ref 0–20)

## 2023-06-30 LAB — CK: Total CK: 88 U/L (ref 7–232)

## 2023-06-30 NOTE — Patient Instructions (Addendum)
Thank you for coming in today.   Please get an Xray today before you leave   Please get labs today before you leave   You received an injection today. Seek immediate medical attention if the joint becomes red, extremely painful, or is oozing fluid.   Check back in 3 weeks

## 2023-06-30 NOTE — Progress Notes (Signed)
Rubin Payor, PhD, LAT, ATC acting as a scribe for [name], MD.  Craig Jones is a 62 y.o. male who presents to [facility] at [facility] today for f/u myalgia, chronic neck and back pain, w/ MRI review. Pt was last seen by Dr. Denyse Amass on 06/05/23 and he was advised to cont PT and a sacral MRI was ordered.   Today, pt reports back pain is the same. No radiating pain. He locates pain to both buttocks and into the lateral hip. He also notes L foot pain is worsening. He locates pain to the plantar aspect of the L foot and lateral aspect. His foot pain is most bothersome today and causing him to limp.  Additionally has some right knee pain.  He notes pain in the bilateral low back and lateral hips left worse than right.  Dx testing: 06/22/23 Sacrum MRI 05/18/23 L-spine & C-spine MRI             05/10/23 L-spine & C-spine XR and labs 08/01/21 L-spine, Ab/Pelvis, T-spine, & C-spine CT   Pertinent review of systems: No fevers or chills.  Polyarthralgias and myalgias.  Pain multiple locations.  Relevant historical information: Hypertension and hyperlipidemia.   Exam:  BP 138/82   Pulse (!) 59   Ht 5\' 7"  (1.702 m)   Wt 176 lb (79.8 kg)   SpO2 98%   BMI 27.57 kg/m  General: Well Developed, well nourished, and in no acute distress.   MSK: L-spine nontender to palpation midline.  Tender palpation bilateral paraspinal musculature and SI joints left worse than right.  Hips tender palpation greater trochanter bilaterally.  Pain with hip abduction.  Right knee mild effusion normal motion with crepitation.  Left foot: Normal appearing. Tender palpation dorsal lateral midfoot and plantar lateral midfoot.  Lab and Radiology Results  Procedure: Real-time Ultrasound Guided Injection of right lateral hip greater trochanter bursa Device: Philips Affiniti 50G/GE Logiq Images permanently stored and available for review in PACS Verbal informed consent obtained.  Discussed  risks and benefits of procedure. Warned about infection, bleeding, hyperglycemia damage to structures among others. Patient expresses understanding and agreement Time-out conducted.   Noted no overlying erythema, induration, or other signs of local infection.   Skin prepped in a sterile fashion.   Local anesthesia: Topical Ethyl chloride.   With sterile technique and under real time ultrasound guidance: 40 mg of Kenalog and 2 ml of Marcaine injected into bursa. Fluid seen entering the bursa.   Completed without difficulty   Pain immediately resolved suggesting accurate placement of the medication.   Advised to call if fevers/chills, erythema, induration, drainage, or persistent bleeding.   Images permanently stored and available for review in the ultrasound unit.  Impression: Technically successful ultrasound guided injection.    Procedure: Real-time Ultrasound Guided Injection of left lateral hip greater trochanter bursa Device: Philips Affiniti 50G/GE Logiq Images permanently stored and available for review in PACS Verbal informed consent obtained.  Discussed risks and benefits of procedure. Warned about infection, bleeding, hyperglycemia damage to structures among others. Patient expresses understanding and agreement Time-out conducted.   Noted no overlying erythema, induration, or other signs of local infection.   Skin prepped in a sterile fashion.   Local anesthesia: Topical Ethyl chloride.   With sterile technique and under real time ultrasound guidance: 40 mg of Kenalog and 2 mL of Marcaine injected into the bursa. Fluid seen entering the bursa.   Completed without difficulty   Pain bursa immediately  resolved suggesting accurate placement of the medication.   Advised to call if fevers/chills, erythema, induration, drainage, or persistent bleeding.   Images permanently stored and available for review in the ultrasound unit.  Impression: Technically successful ultrasound guided  injection.  X-ray images right knee and left foot obtained today personally and independently interpreted  Right knee: Mild patellofemoral DJD.  No acute fractures.  Left foot: No acute fractures mild midfoot DJD.  Await formal radiology review   EXAM: MRI SACRUM WITHOUT AND WITH CONTRAST   TECHNIQUE: Multiplanar multi-sequence MR imaging of the sacrum was performed before and after the administration of intravenous contrast.   CONTRAST:  8 cc Vueway   COMPARISON:  Lumbar MRI 05/18/2023.  Abdominopelvic CTA 09/03/2021.   FINDINGS: Bones/Joint/Cartilage   As partially imaged on recent lumbar MRI, there is asymmetric T2 hyperintensity along the sacral side of the left sacroiliac joint inferiorly. There is also mild edema more superiorly along the sacral side of the right sacroiliac joint as well as edema along the iliac side bilaterally. There is associated low level marrow enhancement in these areas. Findings are consistent with bilateral sacroiliitis. No significant sacroiliac joint effusion or bone destruction identified.   The hip joints are incompletely visualized. No significant joint effusion or femoral head abnormality identified. There is reactive marrow edema and enhancement within the right ischium at the common hamstring attachment. Asymmetric facet arthropathy at the left L4-5 facet joint with associated marrow and surrounding soft tissue enhancement.   Ligaments   No ligamentous abnormalities are identified.   Muscles and Tendons Mild edema and enhancement are noted within the left iliopsoas muscle the distal aspect of the right gluteus medius tendon, incompletely visualized. No significant tenosynovitis. As above, reactive edema and enhancement within the right ischium with common hamstring tendinosis bilaterally, greater on the right. No focal muscular atrophy or focal fluid collection identified.   Soft tissue No focal periarticular fluid  collections are identified. No evidence of pelvic lymphadenopathy.   IMPRESSION: 1. Bilateral sacroiliitis, left greater than right. This pattern is nonspecific and can be seen with enter a pathic arthritis, ankylosing spondylitis and psoriasis. Correlate clinically. 2. Asymmetric left L4-5 facet arthropathy with associated marrow and surrounding soft tissue enhancement. 3. Mild edema and enhancement within the left iliopsoas muscle and distal aspect of the right gluteus medius tendon, incompletely visualized. 4. Bilateral common hamstring tendinosis with reactive edema in the right ischium.     Electronically Signed   By: Carey Bullocks M.D.   On: 06/30/2023 08:34 I, [name], personally (independently) visualized and performed the interpretation of the images attached in this note.     Assessment and Plan: 62 y.o. male with low back and lateral hip pain.  Multifactorial.  He has SI DJD facet DJD and likely some muscle dysfunction that could be causing his back pain.  He also has what seems to be greater trochanteric bursitis.  Plan for diagnostic and therapeutic greater trochanter injection today.  On recheck consider SI joint injections if this does not work well enough.  Also consider referral to pain management for facet injections or order facet injections through radiology.  Will readdress the knee pain and the foot pain on recheck.  He is a little bit of arthritis in both locations but not as much as I would expect based on his pain.  Consider targeted injections.  Lastly he has multiple pain locations and I am worried about rheumatologic condition.  I did order rheumatology labs in August  but he never got them done.  Encouraged him to get the labs today.  This includes HLA-B27   PDMP not reviewed this encounter. Orders Placed This Encounter  Procedures   DG Knee AP/LAT W/Sunrise Right    Standing Status:   Future    Number of Occurrences:   1    Standing Expiration  Date:   06/29/2024    Order Specific Question:   Reason for Exam (SYMPTOM  OR DIAGNOSIS REQUIRED)    Answer:   eval kne epain    Order Specific Question:   Preferred imaging location?    Answer:   [facility]   DG Foot Complete Left    Standing Status:   Future    Number of Occurrences:   1    Standing Expiration Date:   06/29/2024    Order Specific Question:   Reason for Exam (SYMPTOM  OR DIAGNOSIS REQUIRED)    Answer:   eval midfoot pain    Order Specific Question:   Preferred imaging location?    Answer:   [facility]   [place] LIMITED JOINT SPACE STRUCTURES LOW BILAT(NO LINKED CHARGES)    Order Specific Question:   Reason for Exam (SYMPTOM  OR DIAGNOSIS REQUIRED)    Answer:   hip oinj    Order Specific Question:   Preferred imaging location?    Answer:   [facility]   No orders of the defined types were placed in this encounter.    Discussed warning signs or symptoms. Please see discharge instructions. Patient expresses understanding.   The above documentation has been reviewed and is accurate and complete [name], M.D.

## 2023-07-04 LAB — ANA: Anti Nuclear Antibody (ANA): NEGATIVE

## 2023-07-04 LAB — HLA-B27 ANTIGEN: HLA-B27 Antigen: POSITIVE — AB

## 2023-07-05 NOTE — Addendum Note (Signed)
Addended by: Rodolph Bong on: 07/05/2023 07:10 AM   Modules accepted: Orders

## 2023-07-05 NOTE — Progress Notes (Signed)
Some of your rheumatologic labs are positive.  I am referring to rheumatology.  This will take a few months to get you an appointment.  You should hear soon from them about scheduling.  Please let me know if you do not hear from them within 1 week.

## 2023-07-07 IMAGING — CT CT ANGIO CHEST-ABD-PELV FOR DISSECTION W/ AND WO/W CM
2 of 7 series · 14 of 46 positions shown, 16 images · IV contrast (APPLIED)
Comparison: Chest radiographs 1199 hours today. CT Abdomen and
Pelvis 08/01/2021.

CLINICAL DATA: 60-year-old male with chest and back pain since last
night.

EXAM:
CT ANGIOGRAPHY CHEST, ABDOMEN AND PELVIS
TECHNIQUE: Non-contrast CT of the chest was initially obtained.

[Series 5: axial arterial · axial · arterial · 0.80mm/px · z∈[+736,+1330]mm · 11 of 335 slices shown, 13 images]
[im 19/335  soft-tissue]
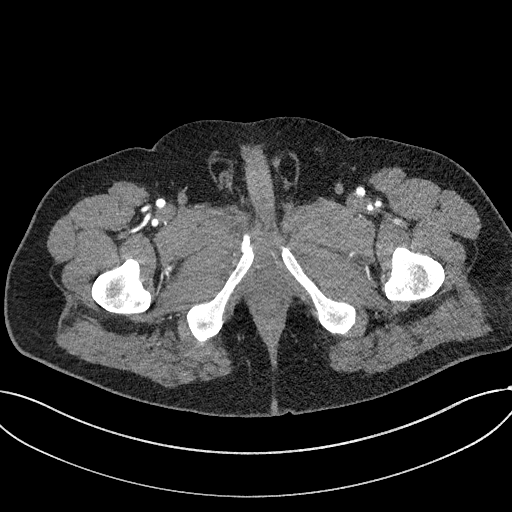
[im 19/335  bone]
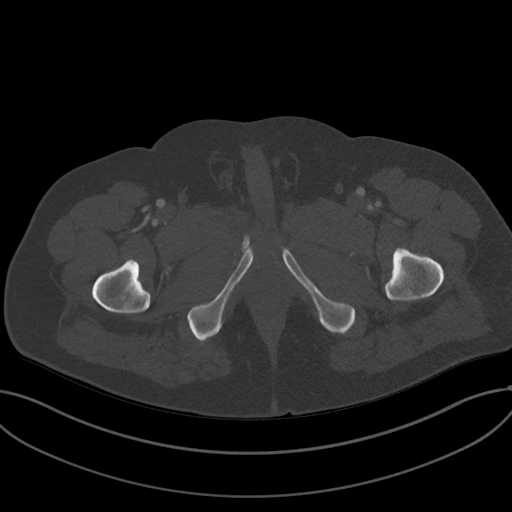
[im 56/335  soft-tissue]
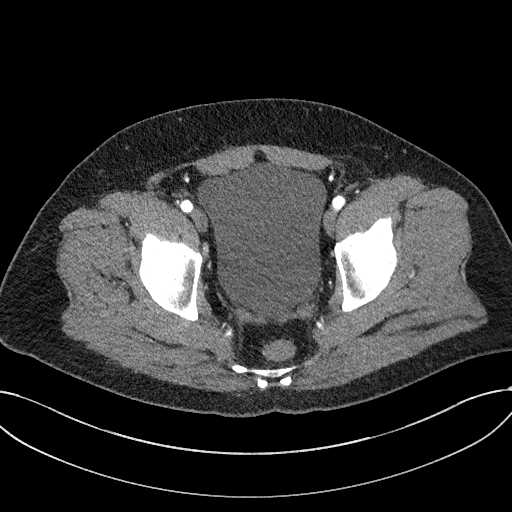
[im 75/335  soft-tissue]
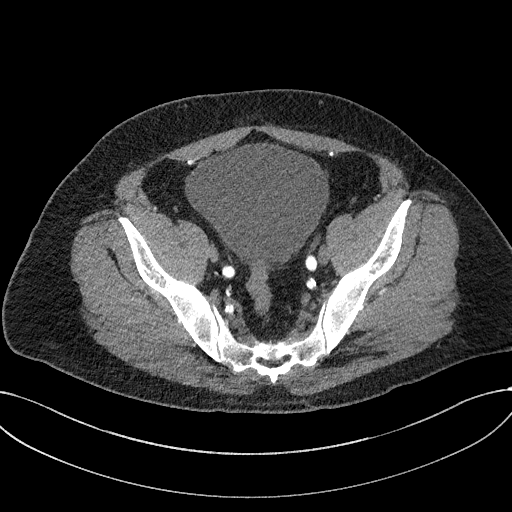
[im 112/335  soft-tissue]
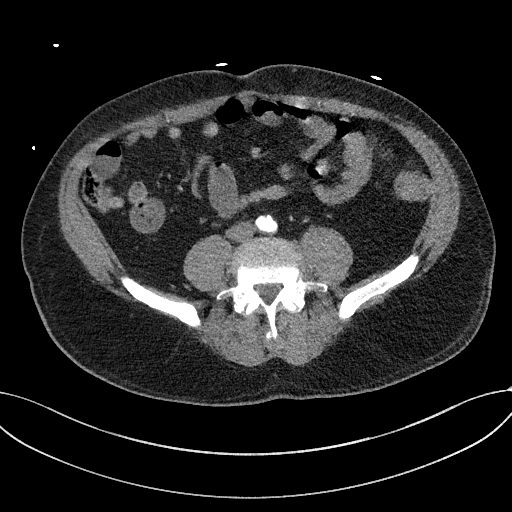
[im 130/335  soft-tissue]
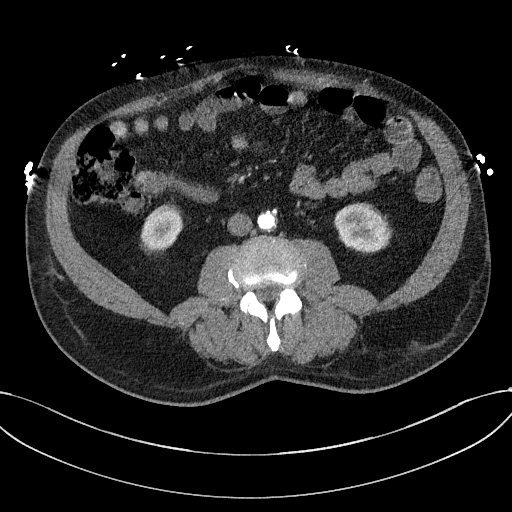
[im 168/335  soft-tissue]
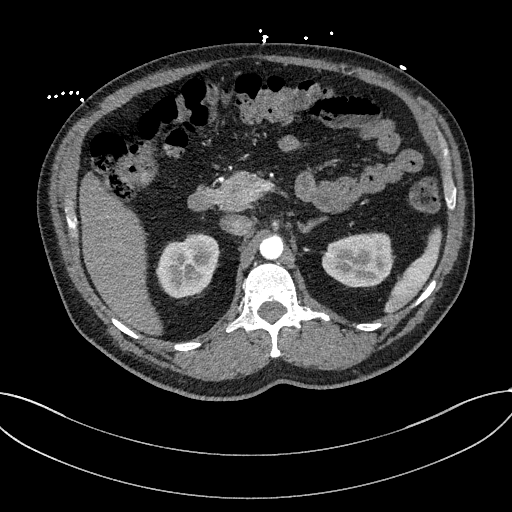
[im 205/335  soft-tissue]
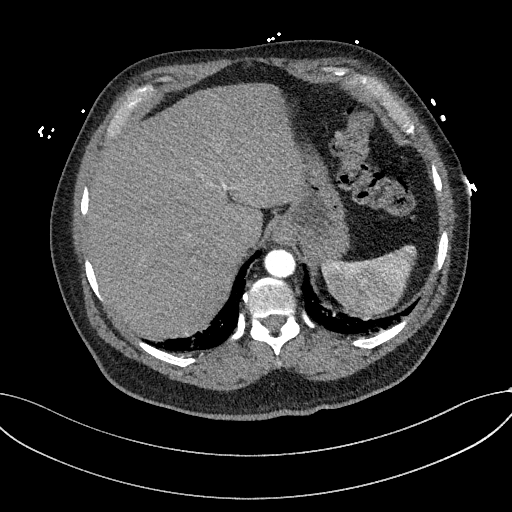
[im 223/335  soft-tissue]
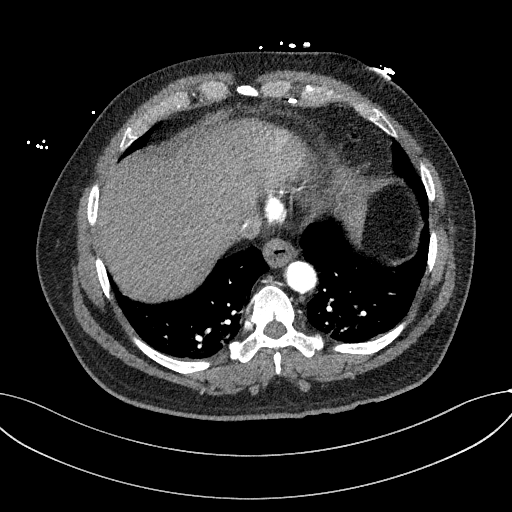
[im 260/335  soft-tissue]
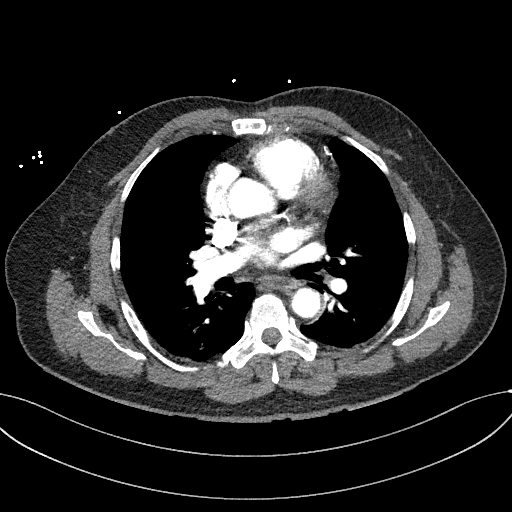
[im 260/335  bone]
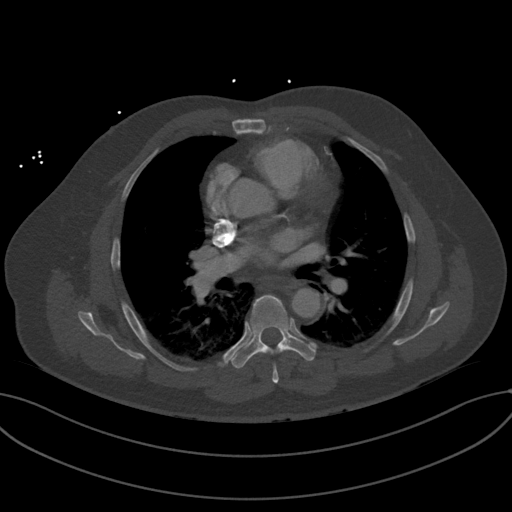
[im 279/335  soft-tissue]
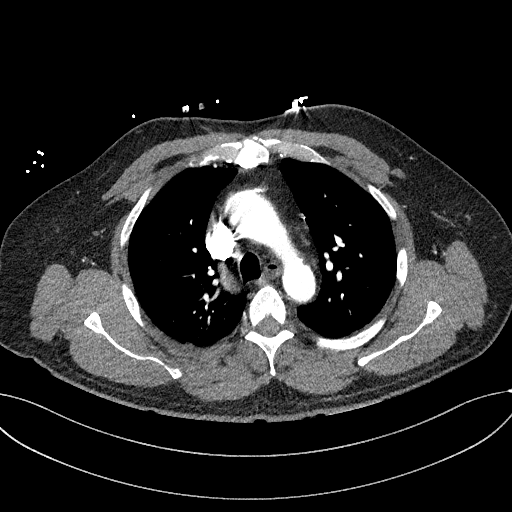
[im 316/335  soft-tissue]
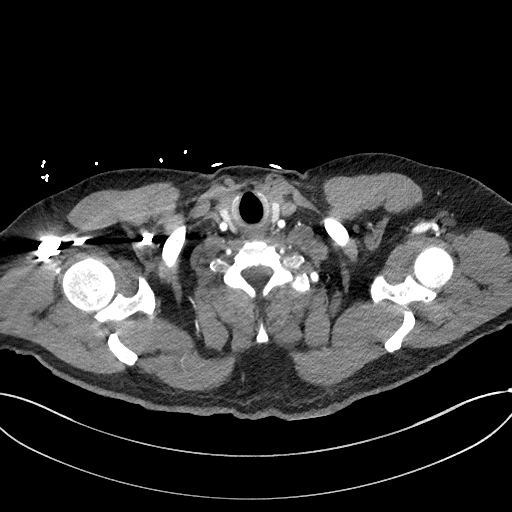

[Series 8: cor soft · coronal · 0.97mm/px · 3 of 164 slices shown]
[im 41/164  soft-tissue]
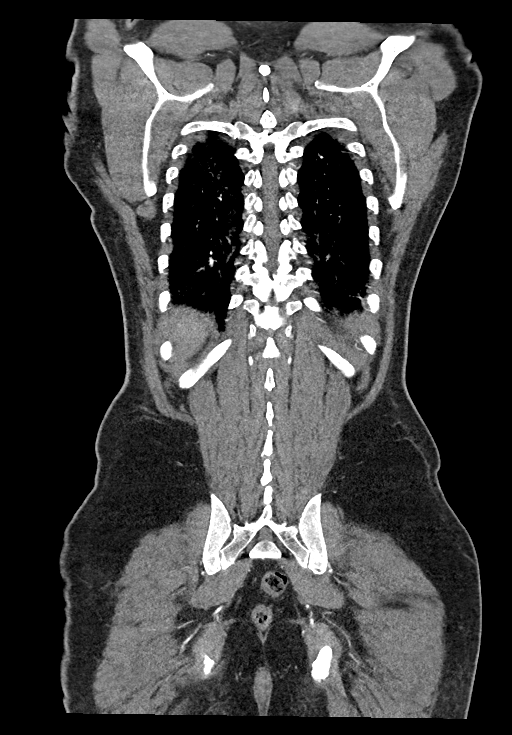
[im 82/164  soft-tissue]
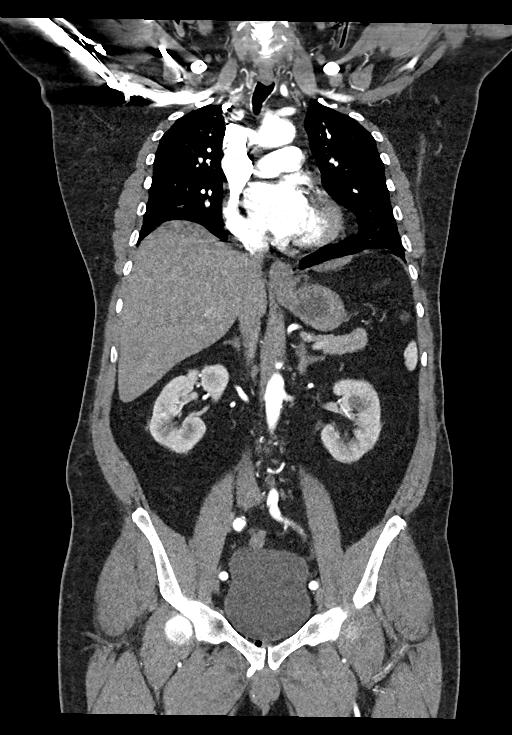
[im 123/164  soft-tissue]
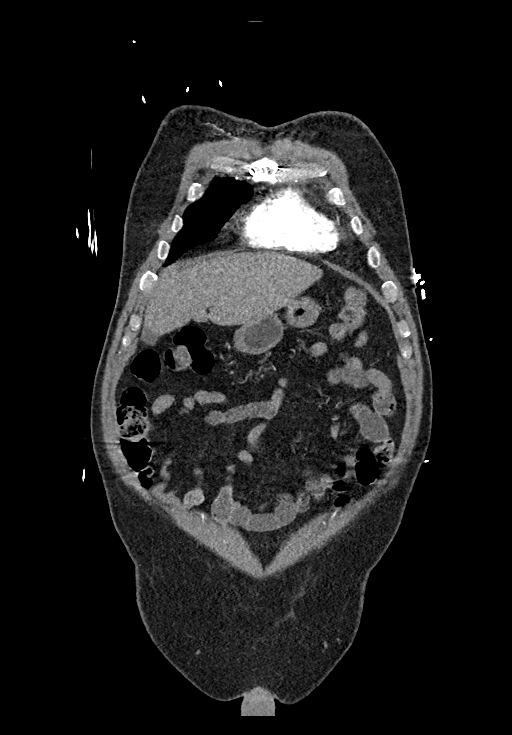

[14 of 46 positions shown; findings below may reference images not displayed]

Multidetector CT imaging through the chest, abdomen and pelvis was
performed using the standard protocol during bolus administration of
intravenous contrast. Multiplanar reconstructed images and MIPs were
obtained and reviewed to evaluate the vascular anatomy.

CONTRAST:  100mL OMNIPAQUE IOHEXOL 350 MG/ML SOLN
FINDINGS: CTA CHEST FINDINGS

Cardiovascular: Prior sternotomy and CABG. Stable borderline
cardiomegaly. No pericardial effusion. Aortic atherosclerosis but
negative for thoracic aortic dissection or aneurysm. Patent proximal
great vessels. Central pulmonary arteries are also enhancing and
appear to be patent.

Mediastinum/Nodes: Negative. No mediastinal mass or lymphadenopathy.

Lungs/Pleura: Major airways are patent. There is mild dependent
pulmonary atelectasis similar to the [place] CT. No pneumothorax,
pleural effusion, or other acute pulmonary abnormality.

Musculoskeletal: Prior sternotomy. No acute osseous abnormality
identified.

Review of the MIP images confirms the above findings.

CTA ABDOMEN AND PELVIS FINDINGS

VASCULAR

Negative for abdominal aortic aneurysm or dissection. Stable
Aortoiliac calcified atherosclerosis, with primarily distal aortic
atherosclerosis. Bilateral iliac artery tortuosity, plaque and
stenosis but the major arterial structures in the abdomen and pelvis
remain patent.

Review of the MIP images confirms the above findings.

NON-VASCULAR

Hepatobiliary: Stable, negative liver and gallbladder.

Pancreas: Negative.

Spleen: Negative.

Adrenals/Urinary Tract: Normal adrenal glands. Symmetric,
nonobstructed kidneys. Decompressed ureters. Distended urinary
bladder (510 mL). No perivesical stranding. Ureters are within
normal limits. No urinary calculus.

Stomach/Bowel: Mild retained stool throughout the large bowel with
occasional diverticula. Normal appendix on series 5, image 241. No
large bowel inflammation. Negative terminal ileum. No dilated small
bowel. Unremarkable stomach and duodenum. No free air, free fluid,
mesenteric stranding.

Lymphatic: No lymphadenopathy.

Reproductive: Negative.

Other: No pelvic free fluid.

Musculoskeletal: Multilevel lumbar endplate Schmorl's nodes are
stable. No acute osseous abnormality identified.

Review of the MIP images confirms the above findings.
IMPRESSION: 1. Negative for aortic dissection or aneurysm. Positive for Aortic
Atherosclerosis ([id number]), prior CABG.
2. Distended urinary bladder (510 mL).  Query urinary retention.
3. Mild pulmonary atelectasis.
4. No other acute or inflammatory process identified in the chest,
abdomen, or pelvis.

## 2023-07-07 IMAGING — DX DG CHEST 2V
2 series · 2 of 2 positions shown · non-contrast
Comparison: 12/02/2011

CLINICAL DATA: Chest pain.

EXAM:
CHEST - 2 VIEW

[chest lat]
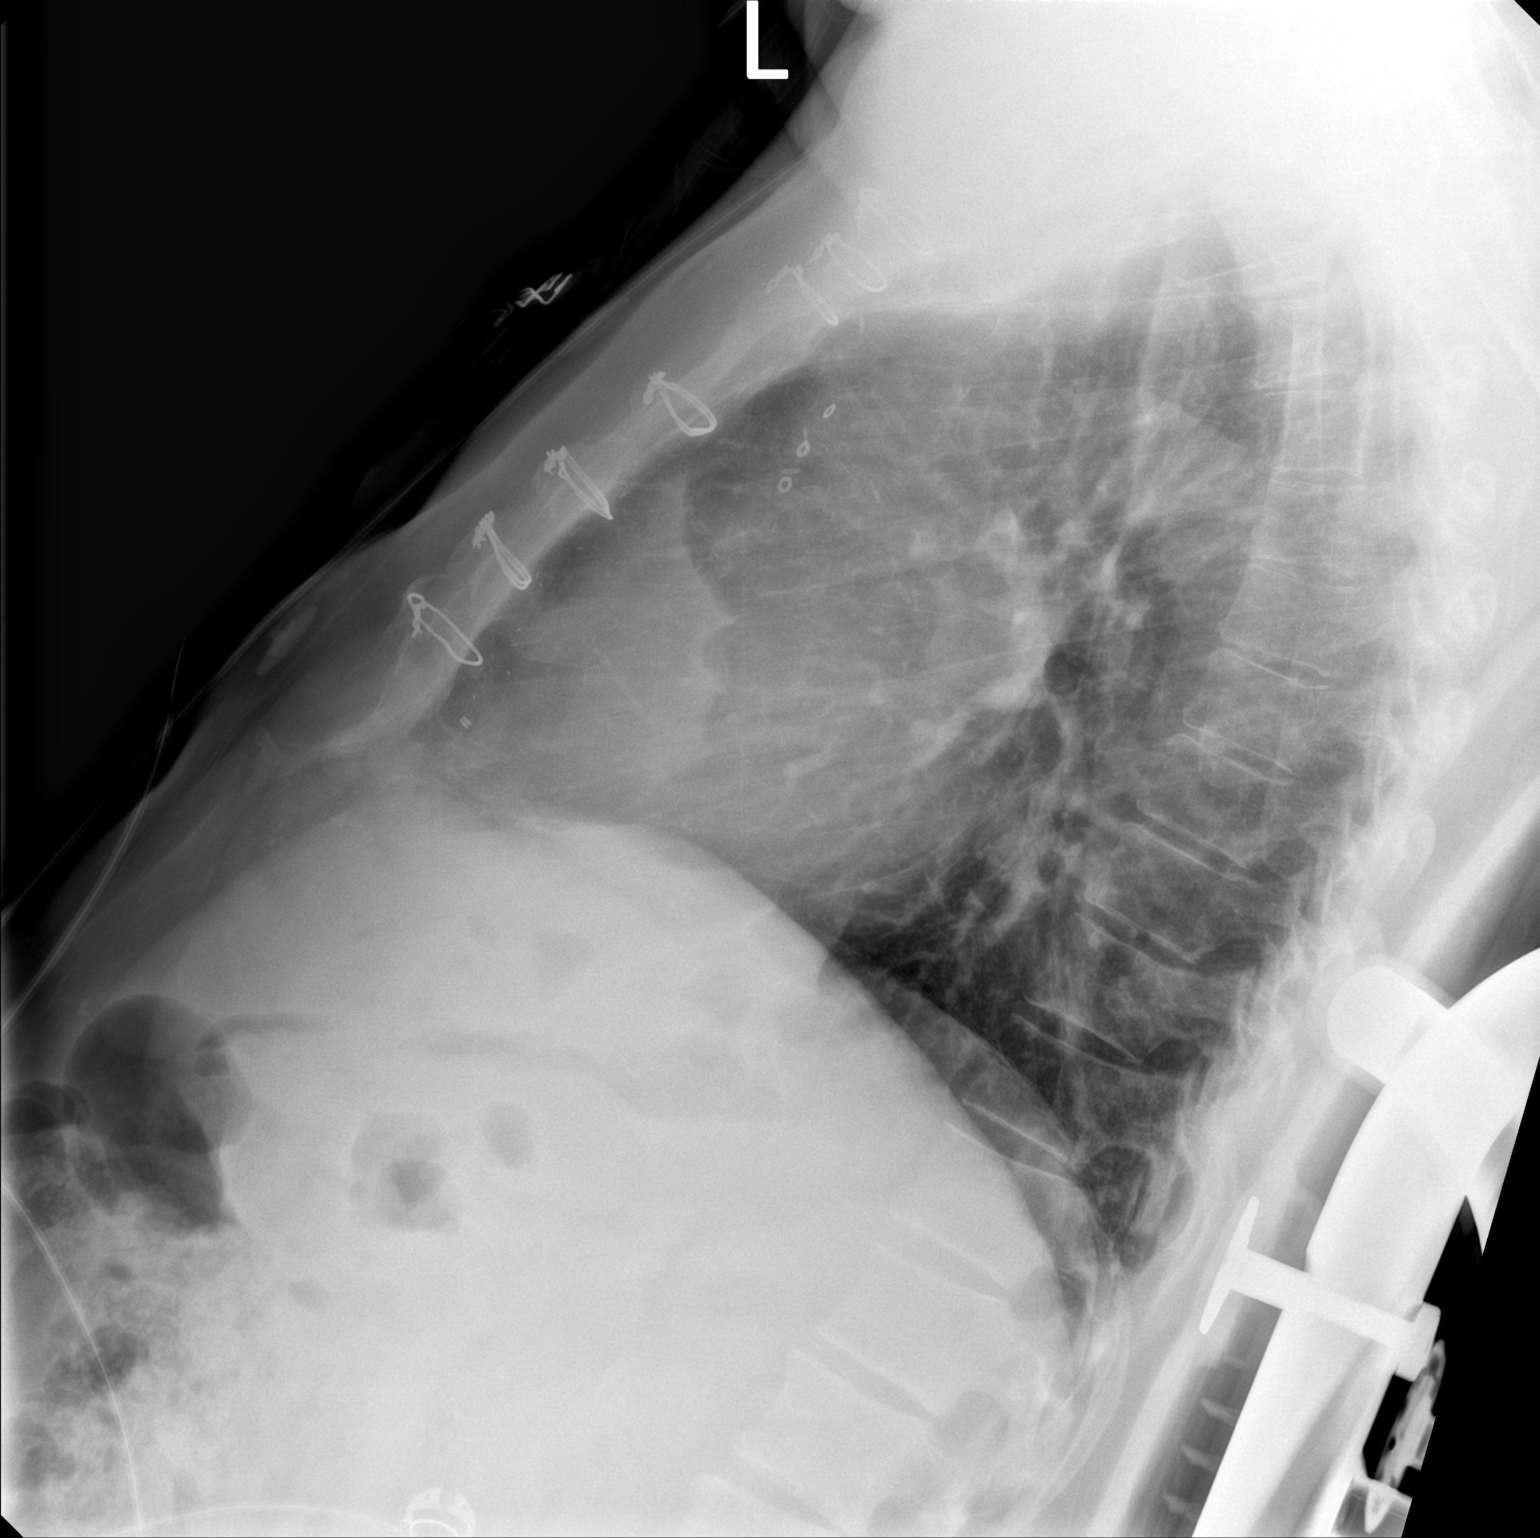

[chest ap]
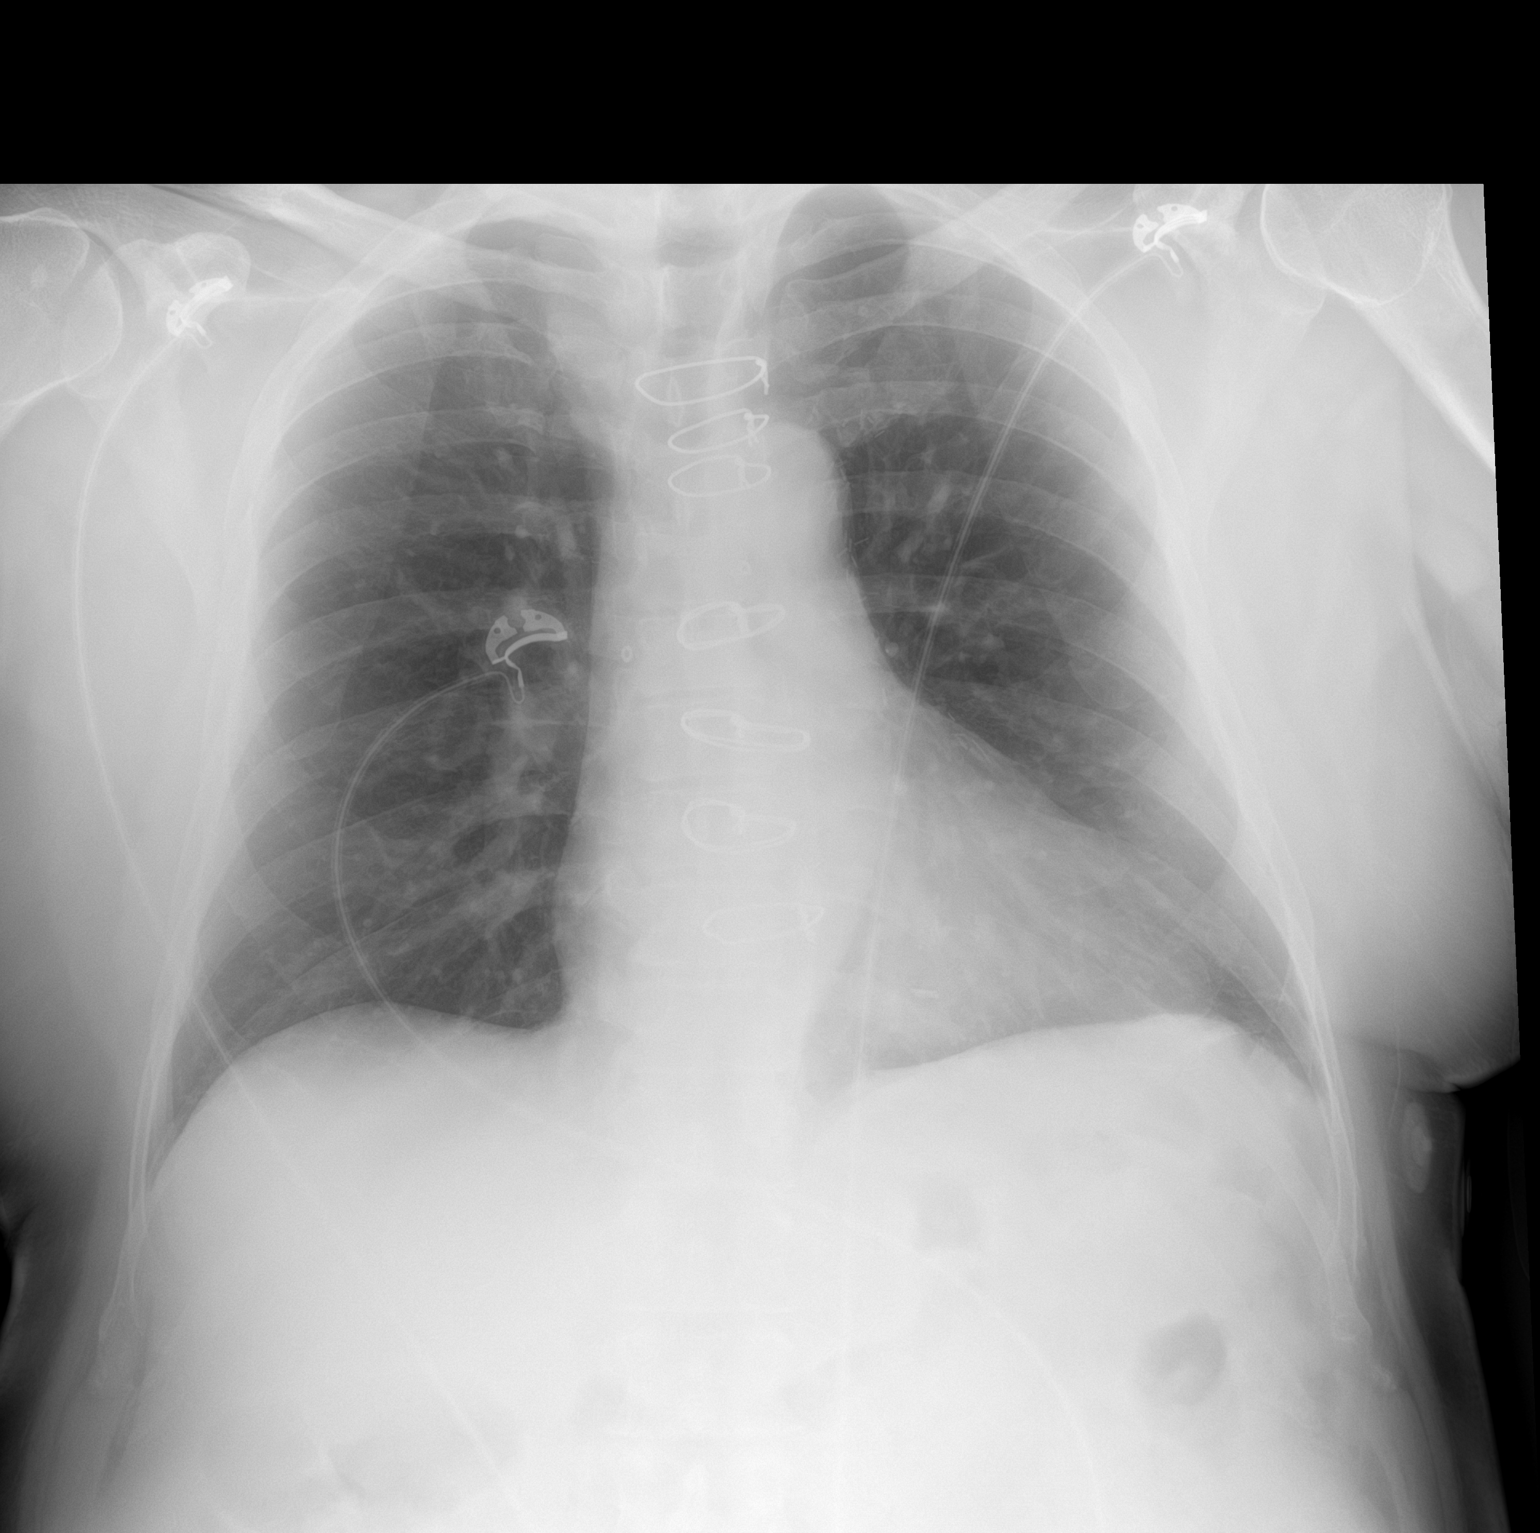

[2 of 2 positions shown; findings below may reference images not displayed]

FINDINGS: Lungs are clear. No pneumothorax or pleural effusion. Coronary
artery bypass grafting has been performed. Cardiac size within
normal limits. Pulmonary vascularity is normal. No acute bone
abnormality.
IMPRESSION: No active cardiopulmonary disease.

## 2023-07-17 NOTE — Progress Notes (Signed)
Left foot x-ray shows a bunion and a little bit of heel spur.

## 2023-07-17 NOTE — Progress Notes (Signed)
Right knee x-ray shows a little bit of arthritis in the front of the knee.

## 2023-07-20 NOTE — Progress Notes (Signed)
Craig Payor, PhD, LAT, ATC acting as a scribe for [name], MD.  Craig Jones is a 62 y.o. male who presents to [facility] at [facility] today for f/u polyarthralgia. Pt was last seen by Dr. Denyse Amass on 06/30/23 and was given a bilat lateral hip steroid injections and was advised to get the labs, as previously ordered.   Today, pt insists that he only had an injection in his R hip, contrary to Dr. Zollie Pee note and charge capture. He feels his hip pain has improved somewhat. He c/o cont'd pain in his low back, plantar aspect of his L foot, and R knee pain.  He is leaving for [place] at the end of November and will be returning in late December or early January.   Dx testing: 06/30/23 L foot & R knee 06/22/23 Sacrum MRI 05/18/23 L-spine & C-spine MRI             05/10/23 L-spine & C-spine XR and labs 08/01/21 L-spine, Ab/Pelvis, T-spine, & C-spine CT  Pertinent review of systems: No fevers or chills  Relevant historical information: Sleep apnea.  CABG.   Exam:  BP 134/82   Pulse (!) 58   Ht 5\' 7"  (1.702 m)   Wt 174 lb (78.9 kg)   SpO2 97%   BMI 27.25 kg/m  General: Well Developed, well nourished, and in no acute distress.   MSK: Right knee mild effusion normal motion with crepitation.  Tender palpation medial joint line.  Left foot tender palpation plantar calcaneus.  Normal foot and ankle motion.    Lab and Radiology Results  Procedure: Real-time Ultrasound Guided Injection of right knee joint superior lateral patella space Device: Philips Affiniti 50G/GE Logiq Images permanently stored and available for review in PACS Verbal informed consent obtained.  Discussed risks and benefits of procedure. Warned about infection, bleeding, hyperglycemia damage to structures among others. Patient expresses understanding and agreement Time-out conducted.   Noted no overlying erythema, induration, or other signs of local infection.   Skin prepped in a sterile  fashion.   Local anesthesia: Topical Ethyl chloride.   With sterile technique and under real time ultrasound guidance: 40 mg of Kenalog and 2 mL of Marcaine injected into knee joint. Fluid seen entering the joint capsule.   Completed without difficulty   Pain immediately resolved suggesting accurate placement of the medication.   Advised to call if fevers/chills, erythema, induration, drainage, or persistent bleeding.   Images permanently stored and available for review in the ultrasound unit.  Impression: Technically successful ultrasound guided injection.   Procedure: Real-time Ultrasound Guided Injection of left plantar fascia origin plantar medial calcaneus Device: Philips Affiniti 50G/GE Logiq Images permanently stored and available for review in PACS Verbal informed consent obtained.  Discussed risks and benefits of procedure. Warned about infection, bleeding, hyperglycemia damage to structures among others. Patient expresses understanding and agreement Time-out conducted.   Noted no overlying erythema, induration, or other signs of local infection.   Skin prepped in a sterile fashion.   Local anesthesia: Topical Ethyl chloride.   With sterile technique and under real time ultrasound guidance: 40 mg of Kenalog and 1 mL of Marcaine injected into plantar fascia. Fluid seen entering the plantar fascia.   Completed without difficulty   Pain immediately resolved suggesting accurate placement of the medication.   Advised to call if fevers/chills, erythema, induration, drainage, or persistent bleeding.   Images permanently stored and available for review in the ultrasound unit.  Impression:  Technically successful ultrasound guided injection.        Assessment and Plan: 62 y.o. male with right knee pain due to mild DJD seen on recent x-ray.  Plan for steroid injection today and reassess in 2 months on recheck.  Left plantar foot pain due to plantar fasciitis.  He has been doing home  exercise programs and conservative management for about a month.  As he is traveling we will proceed with steroid injection today.  Reassess in 2 months on recheck.  Additionally the hip pain that he was seen for last month is improved with injections and home exercise program.  Back pain is doing pretty well too.  He was referred to a rheumatologist and has an appointment in March   PDMP not reviewed this encounter. Orders Placed This Encounter  Procedures   [place] LIMITED JOINT SPACE STRUCTURES LOW RIGHT(NO LINKED CHARGES)    Order Specific Question:   Reason for Exam (SYMPTOM  OR DIAGNOSIS REQUIRED)    Answer:   right knee pain    Order Specific Question:   Preferred imaging location?    Answer:   [facility]   No orders of the defined types were placed in this encounter.    Discussed warning signs or symptoms. Please see discharge instructions. Patient expresses understanding.   The above documentation has been reviewed and is accurate and complete [name], M.D.

## 2023-07-21 ENCOUNTER — Other Ambulatory Visit: Payer: Self-pay

## 2023-07-21 ENCOUNTER — Ambulatory Visit (INDEPENDENT_AMBULATORY_CARE_PROVIDER_SITE_OTHER): Payer: Medicaid Other | Admitting: Family Medicine

## 2023-07-21 VITALS — BP 134/82 | HR 58 | Ht 67.0 in | Wt 174.0 lb

## 2023-07-21 DIAGNOSIS — M25561 Pain in right knee: Secondary | ICD-10-CM | POA: Diagnosis not present

## 2023-07-21 DIAGNOSIS — M722 Plantar fascial fibromatosis: Secondary | ICD-10-CM | POA: Diagnosis not present

## 2023-07-21 DIAGNOSIS — G8929 Other chronic pain: Secondary | ICD-10-CM

## 2023-07-21 IMAGING — DX DG CHEST 2V
2 series · 2 of 2 positions shown · non-contrast
Comparison: Chest x-ray dated September 03, 2021.

CLINICAL DATA: Shortness of breath for the past 2 months.

EXAM:
CHEST - 2 VIEW

[chest pa]
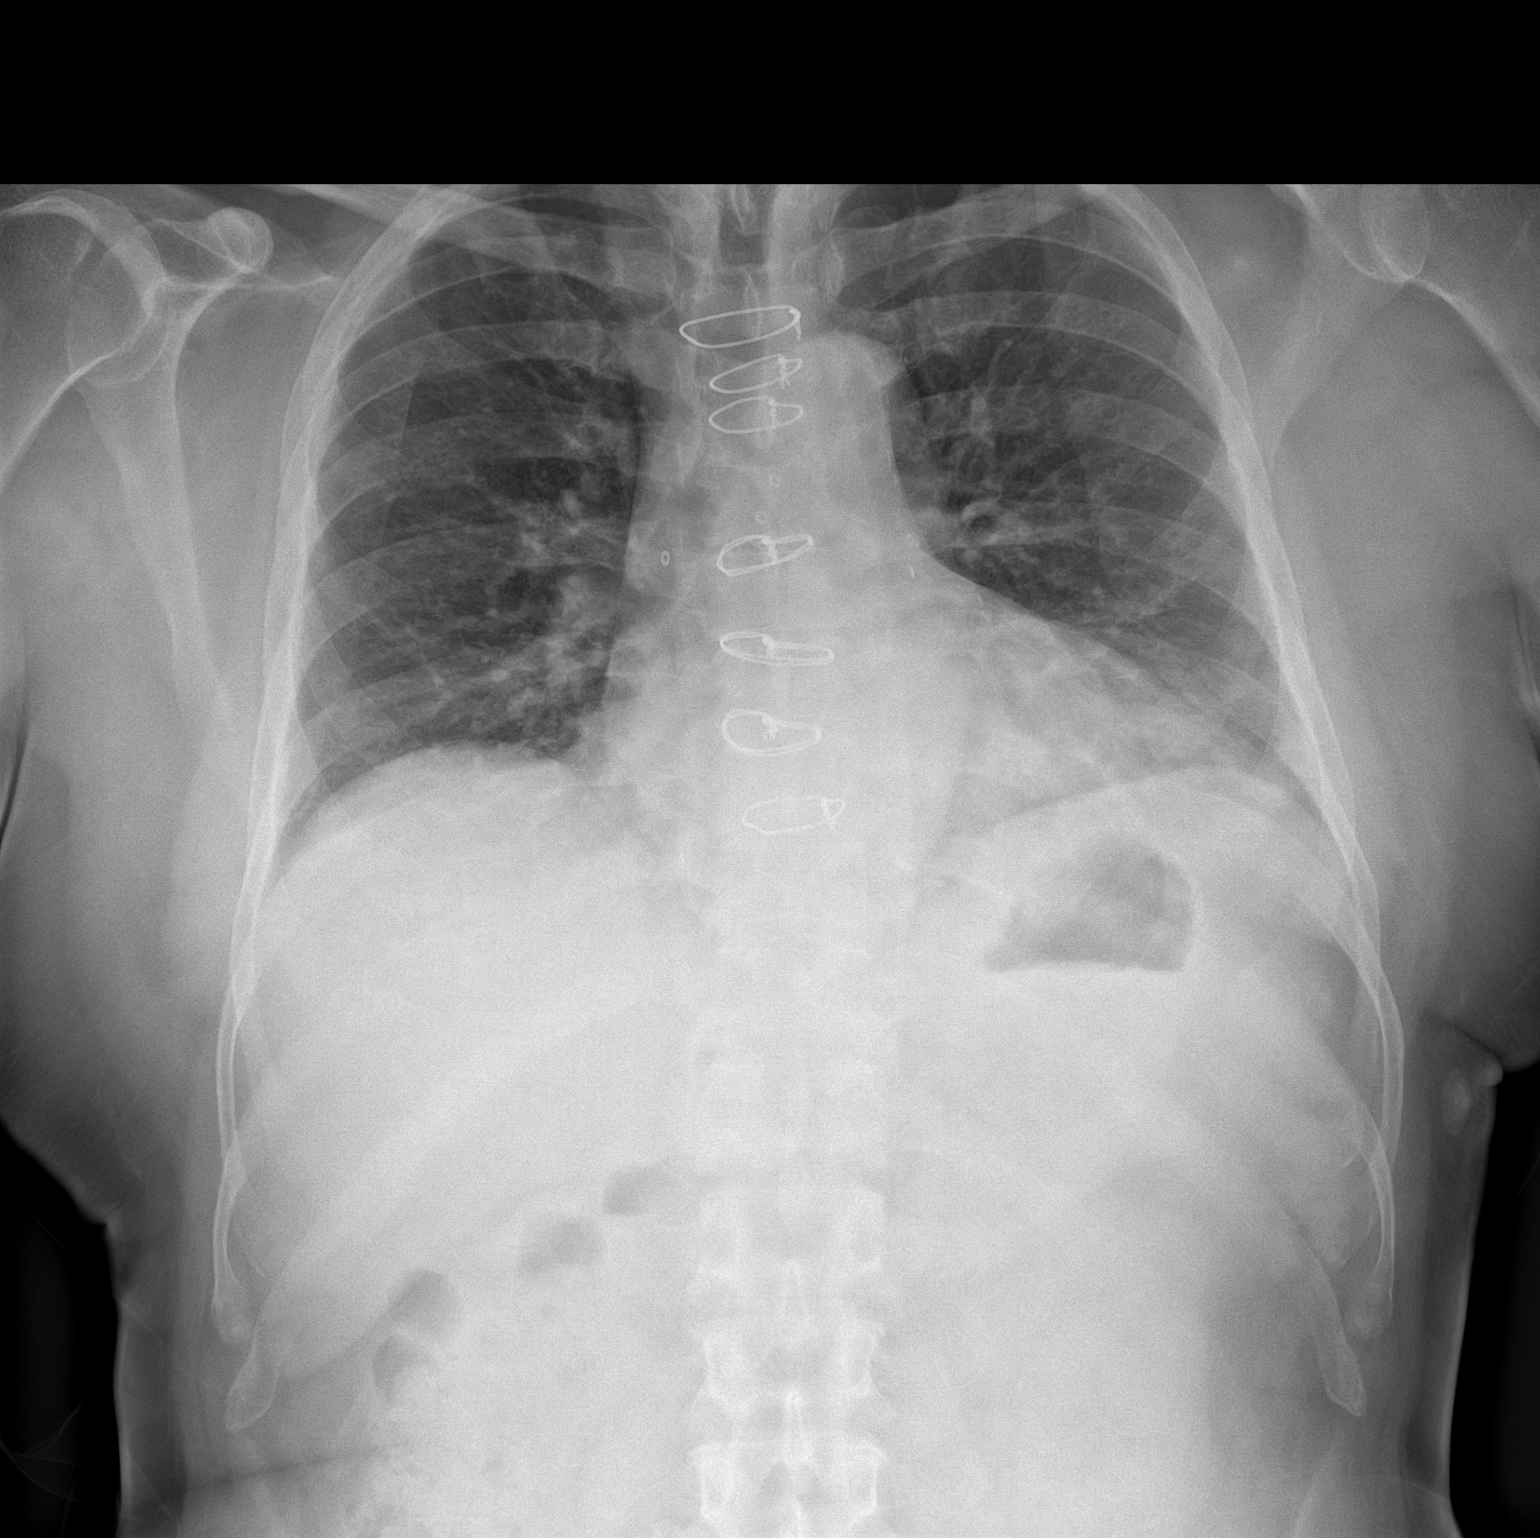

[chest lat]
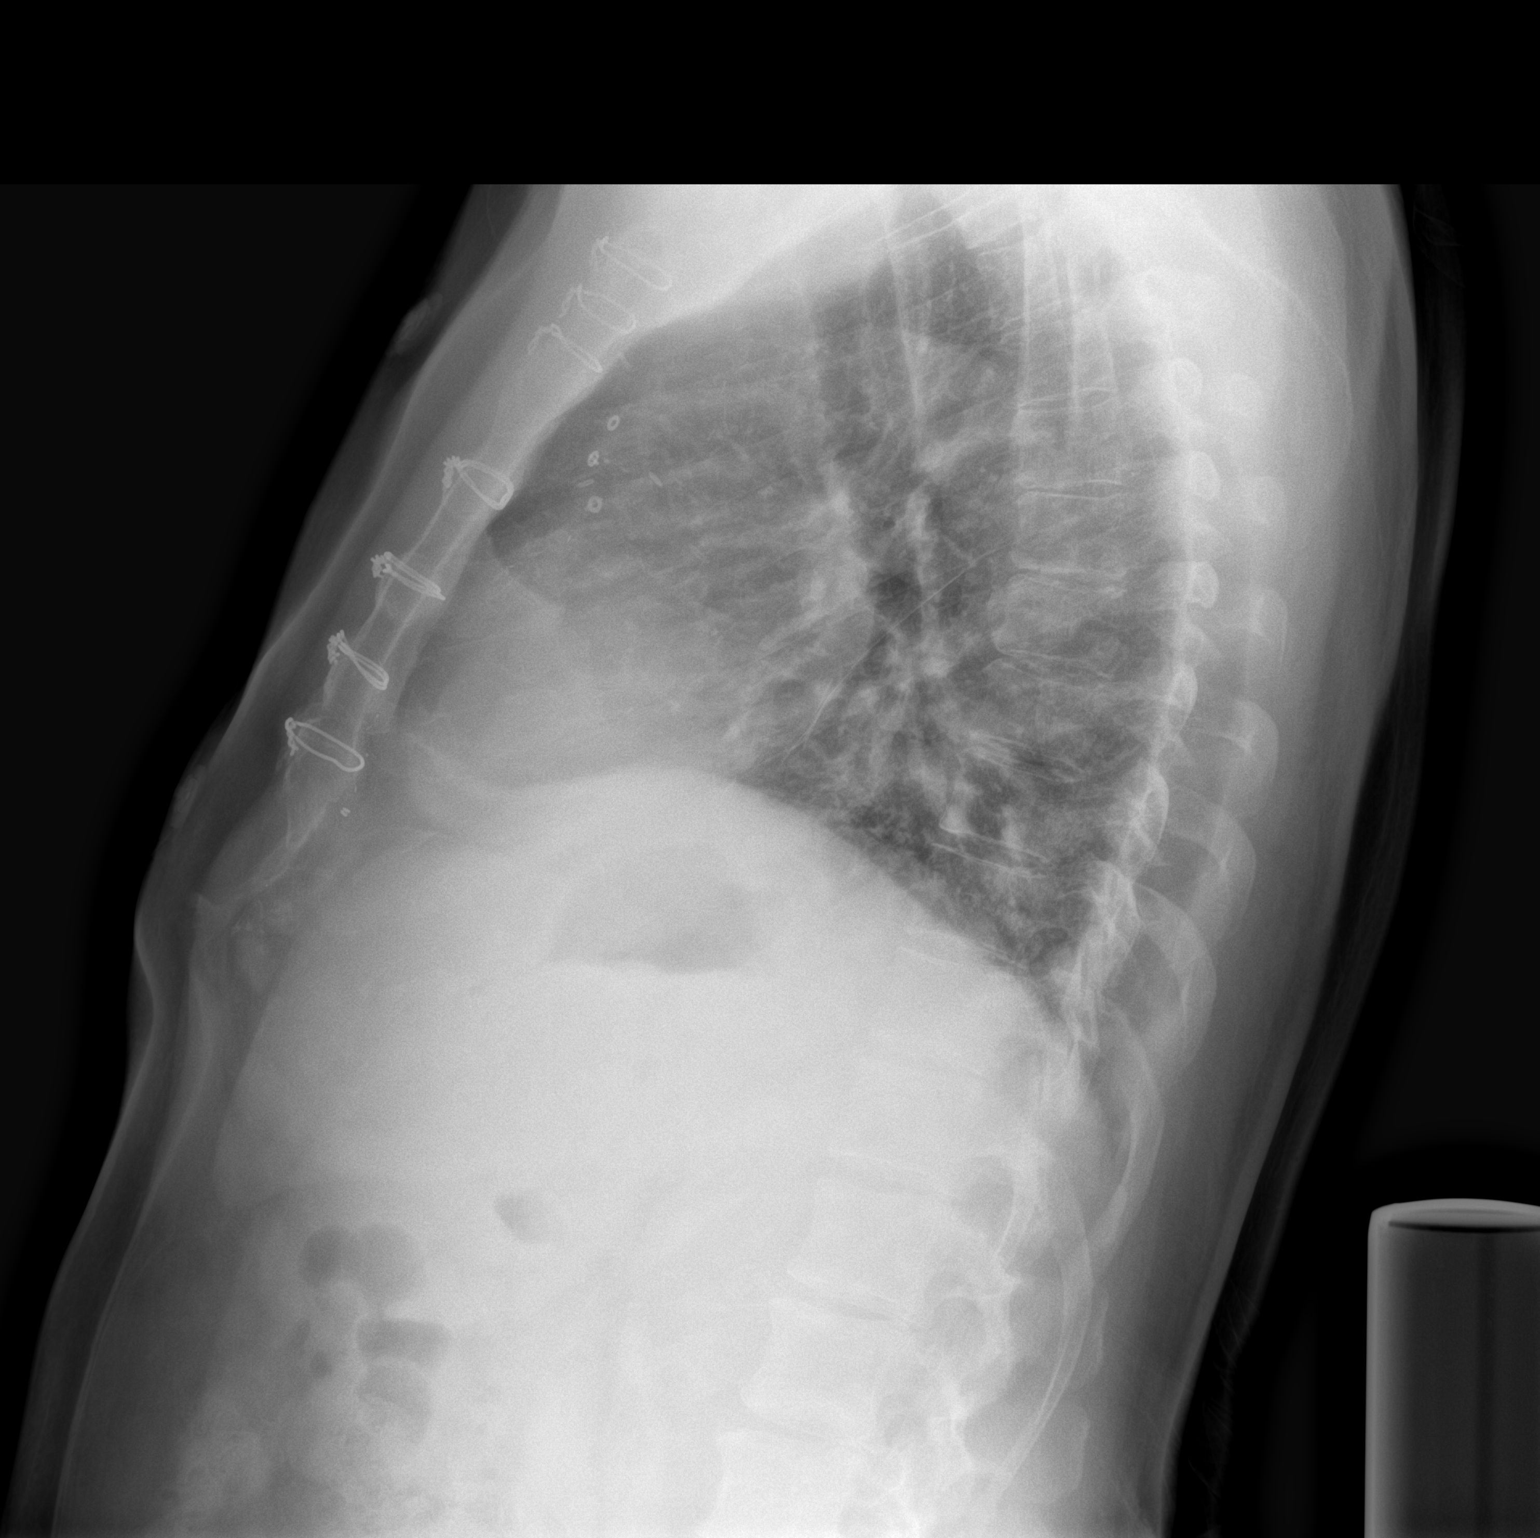

[2 of 2 positions shown; findings below may reference images not displayed]

FINDINGS: Stable cardiomediastinal silhouette status post CABG. Unchanged mild
bilateral lower lobe atelectasis. No focal consolidation, pleural
effusion, or pneumothorax. No acute osseous abnormality.
IMPRESSION: No active cardiopulmonary disease.

## 2023-07-21 NOTE — Patient Instructions (Addendum)
Thank you for coming in today.   You received an injection today. Seek immediate medical attention if the joint becomes red, extremely painful, or is oozing fluid.   Keep working on the exercises  Check back when you return to the [place].

## 2023-09-20 ENCOUNTER — Ambulatory Visit: Payer: Medicaid Other | Admitting: Family Medicine

## 2023-09-20 ENCOUNTER — Ambulatory Visit (INDEPENDENT_AMBULATORY_CARE_PROVIDER_SITE_OTHER): Payer: Medicaid Other

## 2023-09-20 ENCOUNTER — Other Ambulatory Visit: Payer: Self-pay

## 2023-09-20 VITALS — BP 128/76 | HR 75 | Ht 67.0 in | Wt 168.0 lb

## 2023-09-20 DIAGNOSIS — M79672 Pain in left foot: Secondary | ICD-10-CM

## 2023-09-20 DIAGNOSIS — G8929 Other chronic pain: Secondary | ICD-10-CM

## 2023-09-20 DIAGNOSIS — M25572 Pain in left ankle and joints of left foot: Secondary | ICD-10-CM | POA: Diagnosis not present

## 2023-09-20 LAB — COMPREHENSIVE METABOLIC PANEL
ALT: 14 U/L (ref 0–53)
AST: 14 U/L (ref 0–37)
Albumin: 4.1 g/dL (ref 3.5–5.2)
Alkaline Phosphatase: 71 U/L (ref 39–117)
BUN: 11 mg/dL (ref 6–23)
CO2: 30 meq/L (ref 19–32)
Calcium: 9.7 mg/dL (ref 8.4–10.5)
Chloride: 94 meq/L — ABNORMAL LOW (ref 96–112)
Creatinine, Ser: 0.77 mg/dL (ref 0.40–1.50)
GFR: 96.03 mL/min (ref >60.00)
Glucose, Bld: 99 mg/dL (ref 70–99)
Potassium: 3.5 meq/L (ref 3.5–5.1)
Sodium: 131 meq/L — ABNORMAL LOW (ref 135–145)
Total Bilirubin: 0.8 mg/dL (ref 0.2–1.2)
Total Protein: 7.9 g/dL (ref 6.0–8.3)

## 2023-09-20 LAB — URIC ACID: Uric Acid, Serum: 3.9 mg/dL — ABNORMAL LOW (ref 4.0–7.8)

## 2023-09-20 NOTE — Patient Instructions (Addendum)
 Thank you for coming in today.   Please get labs today before you leave   Please get an Xray today before you leave   Please go to [facility] supply to get the CAM Craig Jones we talked about today. You may also be able to get it from [facility].    You received an injection today. Seek immediate medical attention if the joint becomes red, extremely painful, or is oozing fluid.   Check back in 2 weeks

## 2023-09-20 NOTE — Progress Notes (Signed)
 Craig Ileana Collet, PhD, LAT, ATC acting as a scribe for Artist Lloyd, MD.  Craig Jones is a 63 y.o. male who presents to [facility] at [facility] today for pain in multiple locations. Pt was last seen by Dr. Lloyd on 07/21/23 and was given a R knee and L plantar fascia steroid injections.  Today, pt reports 2-wks after his last visit, L foot pain returned and worsened. He was in his home country ([place] and had some type of an aspiration of his L ankle and was prescribed rx.  He describes the doctor removing a syringe of yellow fluid.  Swelling present. Now he is having pain all over his L foot and ankle.   Pertinent review of systems: No fevers or chills  Relevant historical information: No known history of gout   Exam:  BP 128/76   Pulse 75   Ht 5' 7 (1.702 m)   Wt 168 lb (76.2 kg)   SpO2 97%   BMI 26.31 kg/m  General: Well Developed, well nourished, and in no acute distress.   MSK: Left foot and ankle moderate effusion medial and lateral ankle and into the dorsal lateral forefoot.  Normal foot and ankle motion tender palpation medial ankle and dorsal lateral forefoot.    Lab and Radiology Results  Procedure: Real-time Ultrasound Guided Injection of left medial ankle interarticular injection. Device: Philips Affiniti 50G/GE Logiq Images permanently stored and available for review in PACS Mild effusion is present in the ankle joint with some swelling around the posterior tibialis tendon.  No visible tendon tear is present.  Normal-appearing lateral ankle. Verbal informed consent obtained.  Discussed risks and benefits of procedure. Warned about infection, bleeding, hyperglycemia damage to structures among others. Patient expresses understanding and agreement Time-out conducted.   Noted no overlying erythema, induration, or other signs of local infection.   Skin prepped in a sterile fashion.   Local anesthesia: Topical Ethyl chloride.   With sterile  technique and under real time ultrasound guidance: 40 mg of Kenalog and 1 mL of lidocaine thanks injected into medial ankle joint. Fluid seen entering the ankle joint.   Completed without difficulty   Pain immediately resolved suggesting accurate placement of the medication.   Advised to call if fevers/chills, erythema, induration, drainage, or persistent bleeding.   Images permanently stored and available for review in the ultrasound unit.  Impression: Technically successful ultrasound guided injection.   X-ray images left foot and ankle obtained today personally and independently interpreted  Left ankle: Mild medial DJD.  No acute fractures are visible.  Left foot: No acute fractures noticed for DJD.  Await formal radiology review   Vascular ultrasound report from [place] dated August 30, 2023 Report is sent to scan in the original Spanish.  My interpretation report shows a swelling at the medial ankle measuring about 3 cm.  No DVT was seen.  Assessment and Plan: 63 y.o. male with left ankle pain without injury history.  Patient had a vascular ultrasound showing swelling at the medial ankle in [place] in late December.  He had an aspiration of the ankle effusion without injection (I think).  Unfortunately he did not have time to have the proper fluid analysis labs done as he was returning back to that states the next day.  Today differential includes gout was the primary source of pain.  Plan for steroid injection and uric acid labs and metabolic panel labs.  Addition recommend cam walker boot.  Recheck in 2  weeks.  If elevated uric acid seen on labs will treat with colchicine and allopurinol.   PDMP not reviewed this encounter. Orders Placed This Encounter  Procedures   US  LIMITED JOINT SPACE STRUCTURES LOW LEFT(NO LINKED CHARGES)    Reason for Exam (SYMPTOM  OR DIAGNOSIS REQUIRED):   left foot pain    Preferred imaging location?:   [facility]  Ankle Complete Left    Standing Status:   Future    Number of Occurrences:   1    Expiration Date:   09/19/2024    Reason for Exam (SYMPTOM  OR DIAGNOSIS REQUIRED):   left ankle pain    Preferred imaging location?:   [facility]   DG Foot Complete Left    Standing Status:   Future    Number of Occurrences:   1    Expiration Date:   09/19/2024    Reason for Exam (SYMPTOM  OR DIAGNOSIS REQUIRED):   left ankle pain    Preferred imaging location?:   [facility]   Uric acid    Standing Status:   Future    Expiration Date:   09/19/2024   Comprehensive metabolic panel    Standing Status:   Future    Expiration Date:   09/19/2024   No orders of the defined types were placed in this encounter.    Discussed warning signs or symptoms. Please see discharge instructions. Patient expresses understanding.   The above documentation has been reviewed and is accurate and complete Artist Lloyd, M.D.  Visit conducted using a Spanish interpreter.

## 2023-09-21 NOTE — Progress Notes (Signed)
 Uric acid level is normal.  This makes gout unlikely.  Other labs are stable from previous labs are normal.

## 2023-09-21 NOTE — Progress Notes (Signed)
 Left ankle x-ray shows small heel spur

## 2023-09-21 NOTE — Progress Notes (Signed)
 Left foot x-ray shows a little bit of a heel spur but otherwise looks okay.

## 2023-09-22 ENCOUNTER — Ambulatory Visit: Payer: Medicaid Other | Admitting: Family Medicine

## 2023-09-26 ENCOUNTER — Telehealth: Payer: Self-pay | Admitting: Family Medicine

## 2023-09-26 NOTE — Telephone Encounter (Signed)
 Patient's daughter called stating that he is still having left foot and ankle pain that has worsened. He was very worried about this. Patient is scheduled for tomorrow.  Patient's daughter can be reached at [phone].

## 2023-09-27 ENCOUNTER — Other Ambulatory Visit: Payer: Self-pay

## 2023-09-27 ENCOUNTER — Encounter: Payer: Self-pay | Admitting: Family Medicine

## 2023-09-27 ENCOUNTER — Ambulatory Visit: Payer: Medicaid Other | Admitting: Family Medicine

## 2023-09-27 VITALS — BP 128/76 | HR 75 | Ht 67.0 in | Wt 168.0 lb

## 2023-09-27 DIAGNOSIS — M79672 Pain in left foot: Secondary | ICD-10-CM | POA: Diagnosis not present

## 2023-09-27 DIAGNOSIS — M25572 Pain in left ankle and joints of left foot: Secondary | ICD-10-CM

## 2023-09-27 DIAGNOSIS — G8929 Other chronic pain: Secondary | ICD-10-CM

## 2023-09-27 MED ORDER — HYDROCODONE-ACETAMINOPHEN 5-325 MG PO TABS: 1.0000 | ORAL_TABLET | Freq: Four times a day (QID) | ORAL | 0 refills | Status: DC | PRN

## 2023-09-27 NOTE — Patient Instructions (Addendum)
 Thank you for coming in today.   You should hear from MRI scheduling within 1 week. If you do not hear please let me know.    Continue using the CAM walker boot and compression  Check back after we get the MRI results

## 2023-09-27 NOTE — Progress Notes (Signed)
 Craig Muck, PhD, LAT, ATC acting as a scribe for Craig Juniper, MD.  Craig Jones is a 63 y.o. male who presents to [facility] at [facility] today for worsening L foot pain. Pt was last seen by Dr. Alease Hunter on 09/20/23 and labs were obtained, and he was given a L medial ankle interarticular injection. He was also advised to just a CAM walker boot.  Today, pt reports L foot pain has worsened. Pain has spread through the instep of his L foot, medial aspect of the L ankle, and around L heel. He notes worsening swelling. Pain is disturbing his sleep.   He notes worsening foot pain and swelling as well.  Dx testing: 09/20/23 Labs, L foot & ankle XR  Pertinent review of systems: No fevers or chills  Relevant historical information: Hypertension   Exam:  BP 128/76   Pulse 75   Ht 5\' 7"  (1.702 m)   Wt 168 lb (76.2 kg)   SpO2 97%   BMI 26.31 kg/m  General: Well Developed, well nourished, and in no acute distress.   MSK: Left foot and ankle significant swelling across the medial and lateral ankle and into the dorsal forefoot. Tender palpation diffusely across the foot and ankle. Decreased range of motion.    Lab and Radiology Results  Diagnostic Limited MSK Ultrasound of: Left foot and ankle Medial ankle visualized.  Hypoechoic fluid surrounding the posterior tibialis tendon and the digitorum longus tendon. Apparent partial thickness tear of the digitorum longus tendon. Lateral forefoot normal appearing on ultrasound except for soft tissue swelling.  Bony structures are normal to appearance. Impression: Possible partial tear digitorum longus tendon      Assessment and Plan: 63 y.o. male with severe left foot and ankle pain and swelling worsening.  He had initial treatment for this in [place] where he had an aspiration and possibly an injection of a medial ankle structure cyst.  Since then he has been worsening.  He now is using a cam walker boot and  compression sleeve and did not improve with steroid injection I performed in clinic on January 8.  I am concerned that he has a stress fracture in addition to a partial tear.  Plan for MRI foot and ankle and recheck once we get results back.  Continue CAM Walker boot and prescribed hydrocodone .   PDMP reviewed during this encounter. Orders Placed This Encounter  Procedures   US  LIMITED JOINT SPACE STRUCTURES LOW LEFT(NO LINKED CHARGES)    Reason for Exam (SYMPTOM  OR DIAGNOSIS REQUIRED):   left ankle pain    Preferred imaging location?:   [facility]   MR ANKLE LEFT WO CONTRAST    Standing Status:   Future    Expiration Date:   09/26/2024    What is the patient's sedation requirement?:   No Sedation    Does the patient have a pacemaker or implanted devices?:   No    Preferred imaging location?:   [facility] (table limit-350lbs)   MR FOOT LEFT WO CONTRAST    Standing Status:   Future    Expiration Date:   09/26/2024    What is the patient's sedation requirement?:   No Sedation    Does the patient have a pacemaker or implanted devices?:   No    Preferred imaging location?:   [facility] (table limit-350lbs)   Meds ordered this encounter  Medications   HYDROcodone -acetaminophen  (NORCO/VICODIN) 5-325 MG tablet  Sig: Take 1 tablet by mouth every 6 (six) hours as needed.    Dispense:  15 tablet    Refill:  0     Discussed warning signs or symptoms. Please see discharge instructions. Patient expresses understanding.   The above documentation has been reviewed and is accurate and complete Craig Jones, M.D.

## 2023-09-30 ENCOUNTER — Ambulatory Visit: Payer: Medicaid Other

## 2023-09-30 DIAGNOSIS — G8929 Other chronic pain: Secondary | ICD-10-CM

## 2023-09-30 DIAGNOSIS — M25572 Pain in left ankle and joints of left foot: Secondary | ICD-10-CM | POA: Diagnosis not present

## 2023-09-30 DIAGNOSIS — M79672 Pain in left foot: Secondary | ICD-10-CM

## 2023-10-04 ENCOUNTER — Ambulatory Visit: Payer: Medicaid Other | Admitting: Family Medicine

## 2023-10-09 ENCOUNTER — Encounter: Payer: Self-pay | Admitting: Family Medicine

## 2023-10-09 NOTE — Telephone Encounter (Signed)
Using the [employer], pt was called, but there was no answer. VM was left to give [place] a call back to discuss the MRI findings and schedule f/u visit.

## 2023-10-09 NOTE — Progress Notes (Signed)
Left ankle MRI shows tendinitis of multiple tendons and some swelling around the ankle joint.  Return and we will do an injection and talk about the MRI in full detail.

## 2023-10-09 NOTE — Progress Notes (Signed)
Left foot MRI shows arthritis at the big toe.  Return for recheck and possibly injection around the ankle.  Will talk about both MRIs in further detail during that visit.

## 2023-10-09 NOTE — Telephone Encounter (Signed)
Pt called & would like a call back to discuss MRI results.

## 2023-10-10 ENCOUNTER — Other Ambulatory Visit: Payer: Self-pay

## 2023-10-10 ENCOUNTER — Ambulatory Visit (INDEPENDENT_AMBULATORY_CARE_PROVIDER_SITE_OTHER): Payer: Medicaid Other | Admitting: Family Medicine

## 2023-10-10 VITALS — BP 128/76 | HR 76 | Ht 67.0 in | Wt 168.0 lb

## 2023-10-10 DIAGNOSIS — G8929 Other chronic pain: Secondary | ICD-10-CM | POA: Diagnosis not present

## 2023-10-10 DIAGNOSIS — M7752 Other enthesopathy of left foot: Secondary | ICD-10-CM | POA: Diagnosis not present

## 2023-10-10 DIAGNOSIS — M25572 Pain in left ankle and joints of left foot: Secondary | ICD-10-CM | POA: Diagnosis not present

## 2023-10-10 MED ORDER — HYDROCODONE-ACETAMINOPHEN 5-325 MG PO TABS: 1.0000 | ORAL_TABLET | Freq: Four times a day (QID) | ORAL | 0 refills | Status: DC | PRN

## 2023-10-10 NOTE — Patient Instructions (Addendum)
Thank you for coming in today.    You received an injection today. Seek immediate medical attention if the joint becomes red, extremely painful, or is oozing fluid.   Check back next week

## 2023-10-10 NOTE — Telephone Encounter (Signed)
Pt scheduled for f/u visit 1/28.

## 2023-10-10 NOTE — Progress Notes (Signed)
Rubin Payor, PhD, LAT, ATC acting as a scribe for [name], MD.  Craig Jones is a 63 y.o. male who presents to [facility] at [facility] today for f/u L foot/ankle pain w/ MRI review. Pt was last seen by Dr. Denyse Amass on 09/27/23 and was advised to cont using the CAM walker boot, prescribed hydrocodone, and MRI's were ordered.   Today, pt reports L foot/ankle is the same, very painful. He has cont'd to wear the CAM walker boot and added a compression sock. He is out of Norco and has been talking Tylenol  He will be leaving for [place] for about a month starting on February 8.  Dx testing: 09/30/23 L foot & L ankle MRI 09/20/23 Labs, L foot & ankle XR   Pertinent review of systems: No fevers or chills  Relevant historical information: Hypertension   Exam:  BP 128/76   Pulse 76   Ht 5\' 7"  (1.702 m)   Wt 168 lb (76.2 kg)   SpO2 97%   BMI 26.31 kg/m  General: Well Developed, well nourished, and in no acute distress.   MSK: Left foot and ankle: Significant swelling medial and lateral ankle into dorsal lateral foot. Decreased range of motion.  Tender palpation medial ankle and lateral ankle midfoot region. Capillary fill and pulses are intact.    Lab and Radiology Results  Procedure: Real-time Ultrasound Guided Injection of left ankle posterior tibialis tendon sheath medial ankle Device: Philips Affiniti 50G/GE Logiq Images permanently stored and available for review in PACS Verbal informed consent obtained.  Discussed risks and benefits of procedure. Warned about infection, bleeding, hyperglycemia damage to structures among others. Patient expresses understanding and agreement Time-out conducted.   Noted no overlying erythema, induration, or other signs of local infection.   Skin prepped in a sterile fashion.   Local anesthesia: Topical Ethyl chloride.   With sterile technique and under real time ultrasound guidance: 40 mg of Kenalog and 1 mL of  Marcaine injected into posterior tibialis tendon sheath. Fluid seen entering the tendon sheath.   Completed without difficulty   Pain moderately immediately resolved suggesting accurate placement of the medication.   Advised to call if fevers/chills, erythema, induration, drainage, or persistent bleeding.   Images permanently stored and available for review in the ultrasound unit.  Impression: Technically successful ultrasound guided injection.    Procedure: Real-time Ultrasound Guided Injection of left lateral ankle subtalar joint Device: Philips Affiniti 50G/GE Logiq Images permanently stored and available for review in PACS Verbal informed consent obtained.  Discussed risks and benefits of procedure. Warned about infection, bleeding, hyperglycemia damage to structures among others. Patient expresses understanding and agreement Time-out conducted.   Noted no overlying erythema, induration, or other signs of local infection.   Skin prepped in a sterile fashion.   Local anesthesia: Topical Ethyl chloride.   With sterile technique and under real time ultrasound guidance: 40 mg of Kenalog and 1 mL of Marcaine injected into subtalar joint. Fluid seen entering the joint capsule.   Completed without difficulty   Patient had significant reproduction of pain with this injection however once the Marcaine had a chance to work his pain did improve.  Advised to call if fevers/chills, erythema, induration, drainage, or persistent bleeding.   Images permanently stored and available for review in the ultrasound unit.  Impression: Technically successful ultrasound guided injection.    EXAM: MRI OF THE LEFT FOOT WITHOUT CONTRAST   TECHNIQUE: Multiplanar, multisequence MR imaging of the  left foot was performed. No intravenous contrast was administered.   COMPARISON:  Left foot radiographs dated September 20, 2023.   FINDINGS: Bones/Joint/Cartilage   No fracture or dislocation. Normal alignment.  No joint effusion. No marrow signal abnormality. Mild degenerative changes at the first MTP joint with joint space narrowing and subchondral cystic change at the medial first metatarsal head. Mild degenerative changes at the third and fourth TMT joints with subchondral edema at the base of the fourth and fifth metatarsals. No definite erosive changes identified.   Ligaments   Collateral ligaments are intact.  Lisfranc ligament is intact.   Muscles and Tendons   Flexor, peroneal and extensor compartment tendons are grossly intact. Tenosynovitis of the flexor digitorum and flexor hallucis longus tendons at the level of the knot of Henry, as noted on the separately dictated MRI of the left ankle. Mild edema of the abductor hallucis muscle. No muscular atrophy.   Soft tissue Diffuse nonspecific subcutaneous edema at the dorsal foot. No discrete collection identified.   IMPRESSION: 1. No acute osseous abnormality. 2. Mild degenerative changes of the first MTP joint and the third and fourth TMT joints. No definite erosive changes identified. 3. Mild edema of the abductor hallucis muscle, which could reflect stress related changes.     Electronically Signed   By: Hart Robinsons M.D.   On: 10/06/2023 18:02    EXAM: MRI OF THE LEFT ANKLE WITHOUT CONTRAST   TECHNIQUE: Multiplanar, multisequence MR imaging of the ankle was performed. No intravenous contrast was administered.   COMPARISON:  Left ankle radiographs dated September 20, 2023.   FINDINGS: TENDONS   Peroneal: Peroneal longus tendon intact. Peroneal brevis intact. Mild tenosynovitis of the common peroneal tendon sheath at the retromalleolar level.   Posteromedial: Thickening and increased signal of the posterior tibialis tendon with tenosynovitis. Flexor hallucis longus tendon intact. Flexor digitorum longus tendon intact. Tenosynovitis of the flexor digitorum and flexor hallucis longus tendons at the level  of the knot of Henry.   Anterior: Tibialis anterior tendon intact. Extensor hallucis longus tendon intact Extensor digitorum longus tendon intact.   Achilles:  Intact.   Plantar Fascia: Intact.   LIGAMENTS   Lateral: Anterior talofibular ligament intact. Calcaneofibular ligament intact. Posterior talofibular ligament intact. Anterior and posterior tibiofibular ligaments intact. Thickening of the anterior talofibular and calcaneofibular ligaments.   Medial: Deltoid ligament intact. Spring ligament intact.   CARTILAGE   Ankle Joint: There is a tibiotalar joint effusion. Normal ankle mortise. No chondral defect.   Subtalar Joints/Sinus Tarsi: Normal subtalar joints. Small subtalar joint effusion. Normal sinus tarsi.   Bones: No aggressive osseous lesion. No fracture or dislocation. Mild degenerative changes at the talonavicular articulation with dorsal osteophytosis and a small joint effusion.   Soft Tissue: Soft tissue swelling of the ankle and dorsal foot. No significant muscular atrophy.   IMPRESSION: 1. Posterior tibialis tendinosis with tenosynovitis. 2. Mild tenosynovitis of the common peroneal tendon sheath. Tenosynovitis of the flexor digitorum and flexor hallucis longus tendons at the level of the knot of Henry. 3. Tibiotalar joint effusion.  Small subtalar joint effusion. 4. Mild degenerative changes at the talonavicular joint with a small effusion. 5. Sequela of remote anterior talofibular and calcaneofibular ligamentous sprain.     Electronically Signed   By: Hart Robinsons M.D.   On: 10/06/2023 17:51 I, [name], personally (independently) visualized and performed the interpretation of the images attached in this note.   Assessment and Plan: 63 y.o. male with chronic left  foot and ankle pain.  Etiology is a bit unclear and not improving with typical conservative management options. MRI left foot and ankle recently obtained shows multiple locations  of potential pain generators.  I am having trouble isolating his major source of pain.  He already had a trial of a ankle joint injection with little benefit.  Today we will inject the 2 worst areas the posterior tibialis tendon and the subtalar joint.  Hopefully this will provide better pain relief.  Plan to continue CAM Walker boot and use hydrocodone as needed for pain control.  He is leaving for [place] at the end of next week.  Plan for recheck in about a week so we can see how he is doing before he leaves. His pain is severe.  PDMP not reviewed this encounter. Orders Placed This Encounter  Procedures   [place] LIMITED JOINT SPACE STRUCTURES LOW LEFT(NO LINKED CHARGES)    Reason for Exam (SYMPTOM  OR DIAGNOSIS REQUIRED):   left ankle pain    Preferred imaging location?:   [facility]   Meds ordered this encounter  Medications   HYDROcodone-acetaminophen (NORCO/VICODIN) 5-325 MG tablet    Sig: Take 1 tablet by mouth every 6 (six) hours as needed.    Dispense:  30 tablet    Refill:  0     Discussed warning signs or symptoms. Please see discharge instructions. Patient expresses understanding.   The above documentation has been reviewed and is accurate and complete [name], M.D.

## 2023-10-18 ENCOUNTER — Ambulatory Visit (INDEPENDENT_AMBULATORY_CARE_PROVIDER_SITE_OTHER): Payer: Medicaid Other | Admitting: Family Medicine

## 2023-10-18 ENCOUNTER — Encounter: Payer: Self-pay | Admitting: Family Medicine

## 2023-10-18 VITALS — BP 126/72 | HR 83 | Ht 67.0 in | Wt 167.0 lb

## 2023-10-18 DIAGNOSIS — G8929 Other chronic pain: Secondary | ICD-10-CM

## 2023-10-18 DIAGNOSIS — M25572 Pain in left ankle and joints of left foot: Secondary | ICD-10-CM | POA: Diagnosis not present

## 2023-10-18 DIAGNOSIS — L608 Other nail disorders: Secondary | ICD-10-CM

## 2023-10-18 NOTE — Patient Instructions (Addendum)
 Thank you for coming in today.   You should hear form dermatology about the pigmented nail.   Return when you get back.   My cell phone number is [phone]. If you doctor in [place] needs to talk with me they can text me at my number.

## 2023-10-18 NOTE — Progress Notes (Signed)
   [name], PhD, LAT, ATC acting as a scribe for Artist Lloyd, MD.  Craig Jones is a 63 y.o. male who presents to [facility] at [facility] today for f/u L ankle pain. Pt was last seen by Dr. Lloyd on 10/10/23 and was given a L ankle posterior tibialis tendon and L lateral ankle subtalar joint steroid injections. He was advised to cont CAM walker boot and use hydrocodone  prn.  Today, pt reports 80% improvement in his foot and ankle pain. Still some pain. He's been compliant wearing his boot. He notes improvement in the swelling throughout his foot and ankle.    Additionally he has a abnormal appearance of the right great toenail.  He has a dark pigmented area at the toenail that circular in nature that has been present for a year or 2.  His PCP recommended that he check with me for this issue.  Dx testing: 09/30/23 L foot & L ankle MRI 09/20/23 Labs, L foot & ankle XR   Pertinent review of systems: No fevers or chills  Relevant historical information: Hypertension   Exam:  BP 126/72   Pulse 83   Ht 5' 7 (1.702 m)   Wt 167 lb (75.8 kg)   SpO2 98%   BMI 26.16 kg/m  General: Well Developed, well nourished, and in no acute distress.   MSK: Left foot and ankle some swelling across dorsal midfoot.  Overall improved.  Tender palpation medial ankle.  Normal foot and ankle motion.  Right great toe circular oval 2 mm dark pigmented flat appearing lesion underneath the right great toenail.      Assessment and Plan: 63 y.o. male with improving left foot and ankle pain following injections last week.  He is traveling to [place] this weekend and will be gone for 4 to 6 weeks.  He can wean out of the boot when he feels able to.  Check back with me when he returns.  I did provide my cell phone number and happy to talk with the doctor abroad if needed regarding his care.  As for the toenail lesion this is a hyperpigmented nevus beneath the toenail.  He does not have  the characteristic strength and I will be worried about for melanoma but still it has been there for a year or 2 and probably should be biopsied.  Will refer to dermatology. Letter written to PCP about this.  PDMP not reviewed this encounter. Orders Placed This Encounter  Procedures   Ambulatory referral to Dermatology    Referral Priority:   Routine    Referral Type:   Consultation    Referral Reason:   Specialty Services Required    Requested Specialty:   Dermatology    Number of Visits Requested:   1   No orders of the defined types were placed in this encounter.    Discussed warning signs or symptoms. Please see discharge instructions. Patient expresses understanding.   The above documentation has been reviewed and is accurate and complete Artist Lloyd, M.D.

## 2023-11-24 NOTE — Progress Notes (Deleted)
 Office Visit Note  Patient: Craig Jones             Date of Birth: 1960-10-25           MRN: [id number]             PCP: Andreas Blower., MD Referring: Rodolph Bong, MD Visit Date: 12/08/2023 Occupation: @GUAROCC @  Subjective:  No chief complaint on file.   History of Present Illness: Craig Jones is a 63 y.o. male ***     Activities of Daily Living:  Patient reports morning stiffness for *** {minute/hour:19697}.   Patient {ACTIONS;DENIES/REPORTS:21021675::"Denies"} nocturnal pain.  Difficulty dressing/grooming: {ACTIONS;DENIES/REPORTS:21021675::"Denies"} Difficulty climbing stairs: {ACTIONS;DENIES/REPORTS:21021675::"Denies"} Difficulty getting out of chair: {ACTIONS;DENIES/REPORTS:21021675::"Denies"} Difficulty using hands for taps, buttons, cutlery, and/or writing: {ACTIONS;DENIES/REPORTS:21021675::"Denies"}  No Rheumatology ROS completed.   PMFS History:  Patient Active Problem List   Diagnosis Date Noted   OSA on CPAP 08/27/2019   Chronic bilateral low back pain without sciatica 11/05/2018   Coronary artery disease involving native coronary artery of native heart without angina pectoris 10/26/2018   Essential hypertension 10/26/2018   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   Fatty liver 05/31/2016   Elevated fasting glucose 12/10/2015   Hyperlipidemia, unspecified 10/14/2015    Past Medical History:  Diagnosis Date   Chest pain    Hypertension    Sleep disturbance     No family history on file. Past Surgical History:  Procedure Laterality Date   CARDIAC SURGERY  2018   Social History   Social History Narrative   Not on file   Immunization History  Administered Date(s) Administered   PFIZER(Purple Top)SARS-COV-2 Vaccination 01/09/2020, 02/06/2020     Objective: Vital Signs: There were no vitals taken for this visit.   Physical Exam   Musculoskeletal Exam: ***  CDAI Exam: CDAI Score: -- Patient Global: --; Provider Global: -- Swollen: --; Tender:  -- Joint Exam 12/08/2023   No joint exam has been documented for this visit   There is currently no information documented on the homunculus. Go to the Rheumatology activity and complete the homunculus joint exam.  Investigation: No additional findings.  Imaging: No results found.  Recent Labs: Lab Results  Component Value Date   WBC 11.0 (H) 09/17/2021   HGB 13.5 09/17/2021   PLT 457 (H) 09/17/2021   NA 131 (L) 09/20/2023   K 3.5 09/20/2023   CL 94 (L) 09/20/2023   CO2 30 09/20/2023   GLUCOSE 99 09/20/2023   BUN 11 09/20/2023   CREATININE 0.77 09/20/2023   BILITOT 0.8 09/20/2023   ALKPHOS 71 09/20/2023   AST 14 09/20/2023   ALT 14 09/20/2023   PROT 7.9 09/20/2023   ALBUMIN 4.1 09/20/2023   CALCIUM 9.7 09/20/2023    Speciality Comments: No specialty comments available.  Procedures:  No procedures performed Allergies: Patient has no known allergies.   Assessment / Plan:     Visit Diagnoses: Polyarthralgia - 06/30/23: CK WNL, ANA negative, HLA-B27 positive, ESR 43.  09/20/23: uric acid 3.9  HLA B27 positive - 06/30/23: HLA-B27+  Myalgia  Chronic bilateral low back pain without sciatica  Coronary artery disease involving native coronary artery of native heart without angina pectoris  Essential hypertension  OSA on CPAP  Fatty liver  S/P CABG x 2  History of hyperlipidemia  Orders: No orders of the defined types were placed in this encounter.  No orders of the defined types were placed in this encounter.   Face-to-face time spent with patient was ***  minutes. Greater than 50% of time was spent in counseling and coordination of care.  Follow-Up Instructions: No follow-ups on file.   Gearldine Bienenstock, PA-C  Note - This record has been created using Dragon software.  Chart creation errors have been sought, but may not always  have been located. Such creation errors do not reflect on  the standard of medical care.

## 2023-12-08 ENCOUNTER — Encounter: Payer: Medicaid Other | Admitting: Rheumatology

## 2023-12-08 DIAGNOSIS — I2510 Atherosclerotic heart disease of native coronary artery without angina pectoris: Secondary | ICD-10-CM

## 2023-12-08 DIAGNOSIS — M791 Myalgia, unspecified site: Secondary | ICD-10-CM

## 2023-12-08 DIAGNOSIS — K760 Fatty (change of) liver, not elsewhere classified: Secondary | ICD-10-CM

## 2023-12-08 DIAGNOSIS — M2550 Pain in unspecified joint: Secondary | ICD-10-CM

## 2023-12-08 DIAGNOSIS — I10 Essential (primary) hypertension: Secondary | ICD-10-CM

## 2023-12-08 DIAGNOSIS — Z8639 Personal history of other endocrine, nutritional and metabolic disease: Secondary | ICD-10-CM

## 2023-12-08 DIAGNOSIS — M545 Low back pain, unspecified: Secondary | ICD-10-CM

## 2023-12-08 DIAGNOSIS — Z1589 Genetic susceptibility to other disease: Secondary | ICD-10-CM

## 2023-12-08 DIAGNOSIS — G4733 Obstructive sleep apnea (adult) (pediatric): Secondary | ICD-10-CM

## 2023-12-08 DIAGNOSIS — Z951 Presence of aortocoronary bypass graft: Secondary | ICD-10-CM

## 2023-12-27 ENCOUNTER — Ambulatory Visit: Admitting: Rheumatology

## 2024-02-01 ENCOUNTER — Encounter: Payer: Self-pay | Admitting: Family Medicine

## 2024-02-01 ENCOUNTER — Ambulatory Visit (INDEPENDENT_AMBULATORY_CARE_PROVIDER_SITE_OTHER): Admitting: Family Medicine

## 2024-02-01 VITALS — BP 116/78 | HR 86 | Ht 67.0 in

## 2024-02-01 DIAGNOSIS — G8929 Other chronic pain: Secondary | ICD-10-CM

## 2024-02-01 DIAGNOSIS — M5442 Lumbago with sciatica, left side: Secondary | ICD-10-CM | POA: Diagnosis not present

## 2024-02-01 DIAGNOSIS — M5441 Lumbago with sciatica, right side: Secondary | ICD-10-CM | POA: Diagnosis not present

## 2024-02-01 NOTE — Patient Instructions (Signed)
Thank you for coming in today.   I've referred you to Physical Therapy.  Let us know if you don't hear from them in one week.   Check back in 6 weeks. 

## 2024-02-01 NOTE — Progress Notes (Signed)
 I, Miquel Amen, CMA acting as a scribe for [name], MD.  Craig Jones is a 63 y.o. male who presents to [facility] at [facility] today for low back pain. Pt's last visit for his low back was on 05/10/23  Today, pt c/o back pain x 5 days. Pt locates pain to bilateral lower back pain radiating into the gluteal region and the hips. Sx impacting ambulation. Radiating pain into the glutes and hips, n/t present. Minimal improvement with Cyclobenzaprine , IBU, or lidocaine patches.   Radiating pain: hips/glutes LE numbness/tingling: yes LE weakness: yes Aggravates: ambulation Treatments tried:   Dx imaging: 06/22/23 L-spine & sacrum/SI joints MRI  05/06/23 L-spine XR  Pertinent review of systems: No fevers or chills  Relevant historical information: Patient mentions a few years ago in [place] he had what sounds like a medial branch block and ablation in his lumbar spine that worked well for about a year and a half.  Additionally he was seen in the hospital in [place]  last week where he had a CT scan of his T-spine and L-spine that did show degenerative changes but no acute fractures.   Exam:  BP 116/78   Pulse 86   Ht 5\' 7"  (1.702 m)   SpO2 99%   BMI 26.16 kg/m  General: Well Developed, well nourished, and in no acute distress.   MSK: T-spine nontender to palpation midline. L-spine nontender to palpation midline.  Tender palpation paraspinal musculature. Decreased lumbar motion. Lower extremity strength is intact.    Lab and Radiology Results  HISTORY: back pain   TECHNIQUE: CT of the lumbar spine. Multiplanar reformatted images.   One or more of the following dose reduction techniques utilized: Automated exposure control; Adjustment of the mA and/or kV according to patient size; Use of iterative reconstruction technique.   Note: Unless otherwise specified, incidental findings do not require dedicated imaging follow-up.   COMPARISON: None    FINDINGS:   Mild lumbar dextroscoliosis. Vertebral body alignment is satisfactory. Vertebral body heights are preserved. Osteopenia. Moderate disc space loss with disc bulge and marginal osteophytes. Small lumbar Schmorl's nodes. Bilateral lumbar facet arthrosis. No central canal stenosis. Mild-moderate foraminal stenosis from L2-L3 through L5-S1. No prevertebral or paraspinal soft tissue abnormality. Aortoiliac vascular calcification. Mild degenerative changes of the sacroiliac joints.    IMPRESSION:   No acute fracture or subluxation of the lumbar spine.   Thank you for allowing us  to participate in the care of your patient.   Electronically signed by:  Kathern Panda DO  01/28/2024 05:05 AM CDT RP Workstation: [id number]    ECHNIQUE: CT of the thoracic spine. Multiplanar reformatted images.   One or more of the following dose reduction techniques utilized: Automated exposure control; Adjustment of the mA and/or kV according to patient size; Use of iterative reconstruction technique.   Note: Unless otherwise specified, incidental findings do not require dedicated imaging follow-up.   COMPARISON: None.   FINDINGS:   Thoracic levoscoliosis. Vertebral body alignment is satisfactory. Vertebral body heights are preserved. Osteopenia. Moderate disc space loss with marginal osteophytes from T3-T4 through T7-T8. Mild disc space loss with marginal osteophytes at other thoracic levels. No disk bulge or protrusion. No central canal or foraminal stenosis. Paraspinal soft tissues are unremarkable. Mild dependent subsegmental atelectasis. Coronary artery calcification.    IMPRESSION:   No acute fracture or subluxation of the thoracic spine.   Thank you for allowing us  to participate in the care of your patient.   Electronically signed  by:  Kathern Panda DO  01/28/2024 04:57 AM CDT RP Workstation: [id number]    Assessment and Plan: 63 y.o. male with acute exacerbation of chronic low back pain.   Patient does have degenerative changes seen on his most recent CT scan lumbar spine and on a MRI lumbar spine last year.  He is a good candidate for retrial of physical therapy.  Plan to refer back to PT.  This was very successful last year.  If not successful enough next step would probably be a medial branch block and ablation.  He had this procedure done in [place] which was quite successful.  He thinks he has the the procedure report from the procedure in [place] on a CD at home.  He will send me a copy of the report.  I expect this will be in Spanish but it should be readable and understandable about what levels were done and what procedure was done exactly.   PDMP not reviewed this encounter. Orders Placed This Encounter  Procedures   Ambulatory referral to Physical Therapy    Referral Priority:   Routine    Referral Type:   Physical Medicine    Referral Reason:   Specialty Services Required    Requested Specialty:   Physical Therapy    Number of Visits Requested:   1   No orders of the defined types were placed in this encounter.    Discussed warning signs or symptoms. Please see discharge instructions. Patient expresses understanding.   The above documentation has been reviewed and is accurate and complete [name], M.D.

## 2024-02-19 NOTE — Therapy (Signed)
 OUTPATIENT PHYSICAL THERAPY THORACOLUMBAR EVALUATION   Patient Name: Craig Jones MRN: [id number] DOB:[birth year], 63 y.o., male Today's Date: 02/19/2024  END OF SESSION:   Past Medical History:  Diagnosis Date   Chest pain    Hypertension    Sleep disturbance    Past Surgical History:  Procedure Laterality Date   CARDIAC SURGERY  2018   Patient Active Problem List   Diagnosis Date Noted   OSA on CPAP 08/27/2019   Chronic bilateral low back pain without sciatica 11/05/2018   Coronary artery disease involving native coronary artery of native heart without angina pectoris 10/26/2018   Essential hypertension 10/26/2018   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   Fatty liver 05/31/2016   Elevated fasting glucose 12/10/2015   Hyperlipidemia, unspecified 10/14/2015    PCP: Myrtha Ates  REFERRING PROVIDER: [name]   REFERRING DIAG:  M54.42,M54.41,G89.29 (ICD-10-CM) - Chronic bilateral low back pain with bilateral sciatica     Rationale for Evaluation and Treatment: Rehabilitation  THERAPY DIAG:  No diagnosis found.  ONSET DATE: 01/28/24  SUBJECTIVE:                                                                                                                                                                                           SUBJECTIVE STATEMENT: ***  PERTINENT HISTORY:  Craig Jones is a 63 y.o. male who presents to [facility] at [facility] today for low back pain. Pt's last visit for his low back was on 05/10/23 Pt c/o back pain x 5 days. Pt locates pain to bilateral lower back pain radiating into the gluteal region and the hips. Sx impacting ambulation. Radiating pain into the glutes and hips, n/t present. Minimal improvement with Cyclobenzaprine , IBU, or lidocaine patches.   PAIN:  Are you having pain? {OPRCPAIN:27236}  PRECAUTIONS: {Therapy precautions:24002}  RED FLAGS: {PT Red Flags:29287}   WEIGHT BEARING RESTRICTIONS: {Yes  ***/No:24003}  FALLS:  Has patient fallen in last 6 months? {fallsyesno:27318}  LIVING ENVIRONMENT: Lives with: {OPRC lives with:25569::"lives with their family"} Lives in: {Lives in:25570} Stairs: {opstairs:27293} Has following equipment at home: {Assistive devices:23999}  OCCUPATION: ***  PLOF: {PLOF:24004}  PATIENT GOALS: ***  NEXT MD VISIT: ***  OBJECTIVE:  Note: Objective measures were completed at Evaluation unless otherwise noted.  DIAGNOSTIC FINDINGS:  FINDINGS:  Mild lumbar dextroscoliosis. Vertebral body alignment is satisfactory. Vertebral body heights are preserved. Osteopenia. Moderate disc space loss with disc bulge and marginal osteophytes. Small lumbar Schmorl's nodes. Bilateral lumbar facet arthrosis. No central canal stenosis. Mild-moderate foraminal stenosis from L2-L3 through L5-S1. No prevertebral or paraspinal soft tissue abnormality. Aortoiliac vascular calcification. Mild degenerative  changes of the sacroiliac joints.    IMPRESSION:  No acute fracture or subluxation of the lumbar spine.   PATIENT SURVEYS:  {rehab surveys:24030}  COGNITION: Overall cognitive status: {cognition:24006}     SENSATION: {sensation:27233}  MUSCLE LENGTH: Hamstrings: Right *** deg; Left *** deg Andy Bannister test: Right *** deg; Left *** deg  POSTURE: {posture:25561}  PALPATION: ***  LUMBAR ROM:   AROM eval  Flexion   Extension   Right lateral flexion   Left lateral flexion   Right rotation   Left rotation    (Blank rows = not tested)  LOWER EXTREMITY ROM:     {AROM/PROM:27142}  Right eval Left eval  Hip flexion    Hip extension    Hip abduction    Hip adduction    Hip internal rotation    Hip external rotation    Knee flexion    Knee extension    Ankle dorsiflexion    Ankle plantarflexion    Ankle inversion    Ankle eversion     (Blank rows = not tested)  LOWER EXTREMITY MMT:    MMT Right eval Left eval  Hip flexion    Hip extension    Hip  abduction    Hip adduction    Hip internal rotation    Hip external rotation    Knee flexion    Knee extension    Ankle dorsiflexion    Ankle plantarflexion    Ankle inversion    Ankle eversion     (Blank rows = not tested)  LUMBAR SPECIAL TESTS:  {lumbar special test:25242}  FUNCTIONAL TESTS:  {Functional tests:24029}  GAIT: Distance walked: *** Assistive device utilized: {Assistive devices:23999} Level of assistance: {Levels of assistance:24026} Comments: ***  TREATMENT DATE: ***                                                                                                                                 PATIENT EDUCATION:  Education details: *** Person educated: {Person educated:25204} Education method: {Education Method:25205} Education comprehension: {Education Comprehension:25206}  HOME EXERCISE PROGRAM: ***  ASSESSMENT:  CLINICAL IMPRESSION: Patient is a *** y.o. *** who was seen today for physical therapy evaluation and treatment for ***.   OBJECTIVE IMPAIRMENTS: {opptimpairments:25111}.   ACTIVITY LIMITATIONS: {activitylimitations:27494}  PARTICIPATION LIMITATIONS: {participationrestrictions:25113}  PERSONAL FACTORS: {Personal factors:25162} are also affecting patient's functional outcome.   REHAB POTENTIAL: {rehabpotential:25112}  CLINICAL DECISION MAKING: {clinical decision making:25114}  EVALUATION COMPLEXITY: {Evaluation complexity:25115}   GOALS: Goals reviewed with patient? {yes/no:20286}  SHORT TERM GOALS: Target date: ***  *** Baseline: Goal status: INITIAL  2.  *** Baseline:  Goal status: INITIAL  3.  *** Baseline:  Goal status: INITIAL  4.  *** Baseline:  Goal status: INITIAL  5.  *** Baseline:  Goal status: INITIAL  6.  *** Baseline:  Goal status: INITIAL  LONG TERM GOALS: Target date: ***  *** Baseline:  Goal status: INITIAL  2.  ***  Baseline:  Goal status: INITIAL  3.  *** Baseline:  Goal status:  INITIAL  4.  *** Baseline:  Goal status: INITIAL  5.  *** Baseline:  Goal status: INITIAL  6.  *** Baseline:  Goal status: INITIAL  PLAN:  PT FREQUENCY: {rehab frequency:25116}  PT DURATION: {rehab duration:25117}  PLANNED INTERVENTIONS: {rehab planned interventions:25118::"97110-Therapeutic exercises","97530- Therapeutic [phone]- Neuromuscular re-education","97535- Self JXBJ","47829- Manual therapy"}.  PLAN FOR NEXT SESSION: Donavon Fudge, PT 02/19/2024, 3:44 PM

## 2024-02-20 ENCOUNTER — Ambulatory Visit: Attending: Family Medicine

## 2024-02-20 DIAGNOSIS — R2689 Other abnormalities of gait and mobility: Secondary | ICD-10-CM | POA: Insufficient documentation

## 2024-02-20 DIAGNOSIS — M62830 Muscle spasm of back: Secondary | ICD-10-CM | POA: Diagnosis present

## 2024-02-20 DIAGNOSIS — M5441 Lumbago with sciatica, right side: Secondary | ICD-10-CM | POA: Insufficient documentation

## 2024-02-20 DIAGNOSIS — M5459 Other low back pain: Secondary | ICD-10-CM | POA: Insufficient documentation

## 2024-02-20 DIAGNOSIS — M5416 Radiculopathy, lumbar region: Secondary | ICD-10-CM | POA: Insufficient documentation

## 2024-02-20 DIAGNOSIS — M5442 Lumbago with sciatica, left side: Secondary | ICD-10-CM | POA: Diagnosis not present

## 2024-02-20 DIAGNOSIS — G8929 Other chronic pain: Secondary | ICD-10-CM | POA: Insufficient documentation

## 2024-02-26 ENCOUNTER — Encounter: Payer: Self-pay | Admitting: Physical Therapy

## 2024-02-26 ENCOUNTER — Ambulatory Visit: Admitting: Physical Therapy

## 2024-02-26 DIAGNOSIS — M5416 Radiculopathy, lumbar region: Secondary | ICD-10-CM | POA: Diagnosis not present

## 2024-02-26 DIAGNOSIS — M5459 Other low back pain: Secondary | ICD-10-CM

## 2024-02-26 DIAGNOSIS — R2689 Other abnormalities of gait and mobility: Secondary | ICD-10-CM

## 2024-02-26 DIAGNOSIS — M62830 Muscle spasm of back: Secondary | ICD-10-CM

## 2024-02-26 NOTE — Therapy (Signed)
 OUTPATIENT PHYSICAL THERAPY THORACOLUMBAR EVALUATION   Patient Name: Craig Jones MRN: [id number] DOB:08-23-1961, 63 y.o., male Today's Date: 02/26/2024  END OF SESSION:  PT End of Session - 02/26/24 1600     Visit Number 2    Date for PT Re-Evaluation 05/14/24    PT Start Time 1600    PT Stop Time 1645    PT Time Calculation (min) 45 min    Activity Tolerance Patient tolerated treatment well    Behavior During Therapy Craig [facility] for tasks assessed/performed          Past Medical History:  Diagnosis Date   Chest pain    Hypertension    Sleep disturbance    Past Surgical History:  Procedure Laterality Date   CARDIAC SURGERY  2018   Patient Active Problem List   Diagnosis Date Noted   OSA on CPAP 08/27/2019   Chronic bilateral low back pain without sciatica 11/05/2018   Coronary artery disease involving native coronary artery of native heart without angina pectoris 10/26/2018   Essential hypertension 10/26/2018   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   Fatty liver 05/31/2016   Elevated fasting glucose 12/10/2015   Hyperlipidemia, unspecified 10/14/2015    PCP: Craig Jones  REFERRING PROVIDER: Garlan Jones   REFERRING DIAG:  M54.42,M54.41,G89.29 (ICD-10-CM) - Chronic bilateral low back pain with bilateral sciatica     Rationale for Evaluation and Treatment: Rehabilitation  THERAPY DIAG:  Radiculopathy, lumbar region  Muscle spasm of back  Other abnormalities of gait and mobility  Other low back pain  ONSET DATE: 01/28/24  SUBJECTIVE:                                                                                                                                                                                           SUBJECTIVE STATEMENT:   A lot of pain today   Eval: The back is a big problem. The doctor said medicine, therapy, and Cortizone. He said if the therapy didn't work to do the shot and if that does not work to see a Midwife.    Interpreter Craig [facility]  present   PERTINENT HISTORY:  Craig Jones is a 63 y.o. male who presents to [facility] at [facility] today for low back pain. Pt's last visit for his low back was on 05/10/23 Pt c/o back pain x 5 days. Pt locates pain to bilateral lower back pain radiating into the gluteal region and the hips. Sx impacting ambulation. Radiating pain into the glutes and hips, n/t present. Minimal improvement with Cyclobenzaprine , IBU, or lidocaine patches.   PAIN:  Are you having pain? Yes: NPRS scale:  10/10 Pain location: low back in and into thighs  Pain description: achy  Aggravating factors: sitting, walking, rolling over in bed  Relieving factors: medicine   PRECAUTIONS: None  RED FLAGS: None   WEIGHT BEARING RESTRICTIONS: No  FALLS:  Has patient fallen in last 6 months? No  LIVING ENVIRONMENT: Lives with: lives with their spouse Lives in: House/apartment  OCCUPATION: not working anymore, on disability   PLOF: Independent  PATIENT GOALS: get rid of pain  NEXT MD VISIT: 03/14/24  OBJECTIVE:  Note: Objective measures were completed at Evaluation unless otherwise noted.  DIAGNOSTIC FINDINGS:  FINDINGS:  Mild lumbar dextroscoliosis. Vertebral body alignment is satisfactory. Vertebral body heights are preserved. Osteopenia. Moderate disc space loss with disc bulge and marginal osteophytes. Small lumbar Schmorl's nodes. Bilateral lumbar facet arthrosis. No central canal stenosis. Mild-moderate foraminal stenosis from L2-L3 through L5-S1. No prevertebral or paraspinal soft tissue abnormality. Aortoiliac vascular calcification. Mild degenerative changes of the sacroiliac joints.   IMPRESSION:  No acute fracture or subluxation of the lumbar spine.   COGNITION: Overall cognitive status: Within functional limits for tasks assessed     SENSATION: WFL  MUSCLE LENGTH: Hamstrings: tightness in BLE   POSTURE: rounded shoulders  PALPATION: TTP lumbar spine   LUMBAR ROM:    AROM eval  Flexion Can go just past knees with pain  Extension 75% slight pain  Right lateral flexion Mid thigh with pain  Left lateral flexion Mid thigh with pain  Right rotation WFL  Left rotation 75%   (Blank rows = not tested)  LOWER EXTREMITY ROM:   WFL some pain   LOWER EXTREMITY MMT:  5/5   LUMBAR SPECIAL TESTS:  Straight leg raise test: Positive, Slump test: Negative, and FABER test: Positive  FUNCTIONAL TESTS:  5 times sit to stand: 14.41s Timed up and go (TUG): 12.30s    TREATMENT DATE:  02/26/24 NuStep L 5 x 6 min Slat board calf stretch Shoulder Ext 10lb 2x10 Bridges 2x10 PROM and stretching to bilateal LE   HS, ITB, Piriformis, Glute, K2C  02/20/24-EVAL                                                                                                                                 PATIENT EDUCATION:  Education details: POC, HEP Person educated: Patient Education method: Explanation Education comprehension: verbalized understanding  HOME EXERCISE PROGRAM: Access Code: [email or web address]   Exercises - Supine Lower Trunk Rotation  - 1 x daily - 7 x weekly - 2 sets - 10 reps - Supine Single Knee to Chest Stretch  - 1 x daily - 7 x weekly - 2 reps - 30 hold - Supine Piriformis Stretch with Foot on Ground  - 1 x daily - 7 x weekly - 2 reps - 30 hold - Supine Figure 4 Piriformis Stretch  - 1 x daily - 7 x weekly - 2 reps - 30 hold - Supine Bridge  -  1 x daily - 7 x weekly - 2 sets - 10 reps   ASSESSMENT:  CLINICAL IMPRESSION: Patient is a 63 y.o. male who was seen today for physical therapy treatment for chronic LBP. He presents with an extension based preference, flexion causes a lot of pain. He reports having a rhizolysis in [place] back in 2022 that was very helpful. He feels that he needs this again. His doctor wants him to go through more therapy and possibly Cortizone shots before seeing a neurosurgeon. Patient presents with high pain levels that are easily  irritable. Pt had a rigid trunk with gait.  Postural cue needed for shoulder Ext. He reports that he does not this is is muscular because he can feel it in his bones and spinal cord. Pt reports good pain with passive stretching. Pain more so on the L side than R.   He will benefit from skilled PT to address his pain to increase his activity tolerance. He would like to get back to being active and wants to run again.    OBJECTIVE IMPAIRMENTS: Abnormal gait, difficulty walking, decreased ROM, decreased strength, impaired flexibility, improper body mechanics, and pain.   ACTIVITY LIMITATIONS: carrying, lifting, bending, squatting, sleeping, and locomotion level  PARTICIPATION LIMITATIONS: cleaning, laundry, community activity, and yard work  PERSONAL FACTORS: Age, Past/current experiences, and Time since onset of injury/illness/exacerbation are also affecting patient's functional outcome.   REHAB POTENTIAL: Good  CLINICAL DECISION MAKING: Stable/uncomplicated  EVALUATION COMPLEXITY: Low   GOALS: Goals reviewed with patient? Yes  SHORT TERM GOALS: Target date: 04/02/24  Patient will be independent with initial HEP.  Baseline:  Goal status: INITIAL   LONG TERM GOALS: Target date: 05/14/24  Patient will be independent with advanced/ongoing HEP to improve outcomes and carryover.  Baseline:  Goal status: INITIAL  2.  Patient will report 50-75% improvement in low back pain to improve QOL. <4/10 Baseline: 8/10 Goal status: INITIAL  3.  Patient will report centralization of radicular symptoms.  Baseline: has pain into bilateral thighs Goal status: INITIAL  4.  Patient will demonstrate full pain free lumbar ROM to perform ADLs.   Baseline: see chart Goal status: INITIAL  5.  Patient will be able to return to jogging/running  Baseline: has not ran in 2 years  Goal status: INITIAL   PLAN:  PT FREQUENCY: 1x/week  PT DURATION: 12 weeks  PLANNED INTERVENTIONS: 97110-Therapeutic  exercises, 97530- Therapeutic activity, 97112- Neuromuscular re-education, 97535- Self Care, 16109- Manual therapy, [phone]- Gait training, [phone]- Electrical stimulation (unattended), 97035- Ultrasound, [id number]- Traction (mechanical), [id number]- Ionotophoresis 4mg /ml Dexamethasone, 91478 (1-2 muscles), 20561 (3+ muscles)- Dry Needling, Patient/Family education, Balance training, Stair training, Taping, Joint mobilization, Joint manipulation, Spinal manipulation, Spinal mobilization, Cryotherapy, and Moist heat.  PLAN FOR NEXT SESSION: low back and hip stretching, can try e-stim for pain and traction for radiculopathy    Ollen Beverage, PTA 02/26/2024, 4:00 PM

## 2024-02-28 ENCOUNTER — Ambulatory Visit: Admitting: Physical Therapy

## 2024-02-29 ENCOUNTER — Ambulatory Visit: Admitting: Family Medicine

## 2024-02-29 ENCOUNTER — Other Ambulatory Visit: Payer: Self-pay

## 2024-02-29 VITALS — BP 142/84 | HR 71 | Ht 67.0 in | Wt 175.0 lb

## 2024-02-29 DIAGNOSIS — G8929 Other chronic pain: Secondary | ICD-10-CM

## 2024-02-29 DIAGNOSIS — M47816 Spondylosis without myelopathy or radiculopathy, lumbar region: Secondary | ICD-10-CM | POA: Diagnosis not present

## 2024-02-29 DIAGNOSIS — M7062 Trochanteric bursitis, left hip: Secondary | ICD-10-CM | POA: Diagnosis not present

## 2024-02-29 DIAGNOSIS — M5442 Lumbago with sciatica, left side: Secondary | ICD-10-CM | POA: Diagnosis not present

## 2024-02-29 DIAGNOSIS — M5441 Lumbago with sciatica, right side: Secondary | ICD-10-CM | POA: Diagnosis not present

## 2024-02-29 NOTE — Progress Notes (Signed)
 Joanna Muck, PhD, LAT, ATC acting as a scribe for [name], MD.  Craig Jones is a 63 y.o. male who presents to [facility] at [facility] today for f/u LBP. Pt was last seen by Dr. Alease Hunter on 02/01/24 and was re-referred to PT, completing 2 visits.  Today, pt reports low back pain continues. Pain is now radiating into bilat glutes and into the thoracic back and periscapular region. Pain also along the lateral aspect of the hips. -n/t.  Patient did have a medial branch block and ablation in [place] in 2022 that was very successful. His wife sees a Hydrographic surveyor at [facility] that she is very happy with.  Dx imaging: 06/22/23 L-spine & sacrum/SI joints MRI             05/06/23 L-spine XR  Pertinent review of systems: No fevers or chills  Relevant historical information: Hypertension.  Chronic back pain.   Exam:  BP (!) 142/84   Pulse 71   Ht 5' 7 (1.702 m)   Wt 175 lb (79.4 kg)   BMI 27.41 kg/m  General: Well Developed, well nourished, and in no acute distress.   MSK: L-spine: Normal appearing Very limited range of motion Nontender palpation midline. Lower extremity strength is intact except noted below.  Left hip normal.  Normal motion.  Tender palpation posterior aspect of greater trochanter.  Hip abduction strength is diminished and painful.  External rotation strength is intact.    Lab and Radiology Results  Procedure: Real-time Ultrasound Guided Injection of left hip greater trochanter bursa Device: Philips Affiniti 50G/GE Logiq Images permanently stored and available for review in PACS Verbal informed consent obtained.  Discussed risks and benefits of procedure. Warned about infection, bleeding, hyperglycemia damage to structures among others. Patient expresses understanding and agreement Time-out conducted.   Noted no overlying erythema, induration, or other signs of local infection.   Skin prepped in a sterile fashion.   Local  anesthesia: Topical Ethyl chloride.   With sterile technique and under real time ultrasound guidance: 40 mg of Kenalog and 2 mL's of Marcaine injected into greater trochanter bursa. Fluid seen entering the bursa.   Completed without difficulty   Pain immediately resolved suggesting accurate placement of the medication.   Advised to call if fevers/chills, erythema, induration, drainage, or persistent bleeding.   Images permanently stored and available for review in the ultrasound unit.  Impression: Technically successful ultrasound guided injection.    EXAM: MRI LUMBAR SPINE WITHOUT CONTRAST   TECHNIQUE: Multiplanar, multisequence MR imaging of the lumbar spine was performed. No intravenous contrast was administered.   COMPARISON:  Lumbar spine radiographs 05/10/2023. Lumbar spine CT 08/01/2021.   FINDINGS: Segmentation:  Standard.   Alignment:  Trace retrolisthesis of L2 on L3.   Vertebrae: No lumbar spine fracture. Multiple Schmorl's nodes as shown on the prior CT, with mild edema noted adjacent to Schmorl's nodes involving the L3 and L4 superior endplates. Mild endplate edema anteriorly at L5-S1, likely degenerative. Partially visualized marrow STIR hyperintensity/edema in the left lateral aspect of the sacrum at S2-3, not included on axial images and only partially covered on sagittal images.   Conus medullaris and cauda equina: Conus extends to the upper L2 level. Conus and cauda equina appear normal.   Paraspinal and other soft tissues: Unremarkable.   Disc levels:   Disc desiccation throughout the lumbar spine. Moderate disc space narrowing from L1-2 through L3-4 and mild narrowing at L4-5 and L5-S1.  T12-L1: Negative.   L1-2: Mild disc bulging without stenosis.   L2-3: Disc bulging results in mild right neural foraminal stenosis without spinal stenosis.   L3-4: Disc bulging, a left foraminal to extraforaminal disc osteophyte complex, and mild facet  hypertrophy result in mild right and moderate left neural foraminal stenosis without spinal stenosis.   L4-5: Disc bulging and mild facet hypertrophy result in mild right and mild-to-moderate left neural foraminal stenosis without spinal stenosis.   L5-S1: Disc bulging and mild facet hypertrophy result in mild bilateral neural foraminal stenosis without spinal stenosis.   IMPRESSION: 1. Partially visualized signal abnormality in the left sacrum at S2-3, possibly edema from a recent fracture versus an inflammatory or neoplastic process. Recommend further evaluation with pelvic MRI without and with contrast. 2. Diffuse lumbar disc degeneration without spinal stenosis. 3. Mild-to-moderate multilevel neural foraminal stenosis as above.     Electronically Signed   By: Aundra Lee M.D.   On: 06/04/2023 12:52   I, [name], personally (independently) visualized and performed the interpretation of the images attached in this note.      Assessment and Plan: 63 y.o. male with chronic low back pain not improving with physical therapy.  This is a chronic ongoing issue that previously responded pretty well to physical therapy.  Additionally about 3 years ago he had a medial branch block and ablation in [place] that was successful.  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  Plan to refer to pain management doctor to consider medial branch block and ablation facet joints.  He is more painful in the left so would suggest left L4-5 and L5-S1 first.  Additionally he has left lateral hip pain that has not improved with physical therapy.  Plan for greater trochanter injection.  Continue physical therapy.   PDMP not reviewed this encounter. Orders Placed This Encounter  Procedures   US  LIMITED JOINT SPACE STRUCTURES LOW BILAT(NO LINKED CHARGES)    Reason for Exam (SYMPTOM  OR DIAGNOSIS REQUIRED):   bilateral hip pain    Preferred  imaging location?:   [facility] referral to Physical Medicine Rehab    Referral Priority:   Routine    Referral Type:   Rehabilitation    Referral Reason:   Specialty Services Required    Requested Specialty:   Physical Medicine and Rehabilitation    Number of Visits Requested:   1   No orders of the defined types were placed in this encounter.    Discussed warning signs or symptoms. Please see discharge instructions. Patient expresses understanding.   The above documentation has been reviewed and is accurate and complete [name], M.D.

## 2024-02-29 NOTE — Patient Instructions (Addendum)
 Thank you for coming in today.   You received an injection today. Seek immediate medical attention if the joint becomes red, extremely painful, or is oozing fluid.   I've referred you to [facility].  Let us  know if you don't hear from them in one week.   Check back as needed  I will be out of the office start August 1st for about 6-8 weeks

## 2024-03-05 ENCOUNTER — Ambulatory Visit: Admitting: Physical Therapy

## 2024-03-14 ENCOUNTER — Ambulatory Visit: Admitting: Family Medicine

## 2024-03-19 ENCOUNTER — Ambulatory Visit

## 2024-03-26 ENCOUNTER — Ambulatory Visit: Admitting: Physical Therapy

## 2024-04-22 ENCOUNTER — Encounter: Payer: Self-pay | Admitting: Physical Therapy

## 2024-04-22 ENCOUNTER — Ambulatory Visit: Attending: Family Medicine | Admitting: Physical Therapy

## 2024-04-22 DIAGNOSIS — M542 Cervicalgia: Secondary | ICD-10-CM | POA: Diagnosis present

## 2024-04-22 DIAGNOSIS — M5416 Radiculopathy, lumbar region: Secondary | ICD-10-CM | POA: Insufficient documentation

## 2024-04-22 DIAGNOSIS — M5459 Other low back pain: Secondary | ICD-10-CM | POA: Diagnosis present

## 2024-04-22 DIAGNOSIS — R2689 Other abnormalities of gait and mobility: Secondary | ICD-10-CM | POA: Insufficient documentation

## 2024-04-22 DIAGNOSIS — M62830 Muscle spasm of back: Secondary | ICD-10-CM | POA: Insufficient documentation

## 2024-04-22 NOTE — Therapy (Signed)
 OUTPATIENT PHYSICAL THERAPY THORACOLUMBAR EVALUATION   Patient Name: Craig Jones MRN: [id number] DOB:April 13, 1961, 63 y.o., male Today's Date: 04/22/2024  END OF SESSION:  PT End of Session - 04/22/24 1423     Visit Number 3    Date for PT Re-Evaluation 05/14/24    PT Start Time 1425    PT Stop Time 1510    PT Time Calculation (min) 45 min    Activity Tolerance Patient tolerated treatment well    Behavior During Therapy [facility] for tasks assessed/performed          Past Medical History:  Diagnosis Date   Chest pain    Hypertension    Sleep disturbance    Past Surgical History:  Procedure Laterality Date   CARDIAC SURGERY  2018   Patient Active Problem List   Diagnosis Date Noted   OSA on CPAP 08/27/2019   Chronic bilateral low back pain without sciatica 11/05/2018   Coronary artery disease involving native coronary artery of native heart without angina pectoris 10/26/2018   Essential hypertension 10/26/2018   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   Fatty liver 05/31/2016   Elevated fasting glucose 12/10/2015   Hyperlipidemia, unspecified 10/14/2015    PCP: Craig Jones  REFERRING PROVIDER: Joane Jones   REFERRING DIAG:  M54.42,M54.41,G89.29 (ICD-10-CM) - Chronic bilateral low back pain with bilateral sciatica     Rationale for Evaluation and Treatment: Rehabilitation  THERAPY DIAG:  Radiculopathy, lumbar region  Other abnormalities of gait and mobility  Other low back pain  Muscle spasm of back  Cervicalgia  ONSET DATE: 01/28/24  SUBJECTIVE:                                                                                                                                                                                           SUBJECTIVE STATEMENT:  Pt reports that he visited [place]  and [place] , while in [place]  he had to go to the emergency room for pain. Reports receiving injections for pain that were strong. Family didn't feel comfortable traveling back to [place] while he  was in so much pain   Eval: The back is a big problem. The doctor said medicine, therapy, and Cortizone. He said if the therapy didn't work to do the shot and if that does not work to see a Midwife.    Interpreter Craig Jones present   PERTINENT HISTORY:  Craig Jones is a 63 y.o. male who presents to [facility] at [facility] today for low back pain. Pt's last visit for his low back was on 05/10/23 Pt c/o back pain x 5 days. Pt locates pain to bilateral lower back  pain radiating into the gluteal region and the hips. Sx impacting ambulation. Radiating pain into the glutes and hips, n/t present. Minimal improvement with Cyclobenzaprine , IBU, or lidocaine patches.   PAIN:  Are you having pain? Yes: NPRS scale: 7-8/10 Pain location: low back in and into thighs  Pain description: achy  Aggravating factors: sitting, walking, rolling over in bed  Relieving factors: medicine   PRECAUTIONS: None  RED FLAGS: None   WEIGHT BEARING RESTRICTIONS: No  FALLS:  Has patient fallen in last 6 months? No  LIVING ENVIRONMENT: Lives with: lives with their spouse Lives in: House/apartment  OCCUPATION: not working anymore, on disability   PLOF: Independent  PATIENT GOALS: get rid of pain  NEXT MD VISIT: 03/14/24  OBJECTIVE:  Note: Objective measures were completed at Evaluation unless otherwise noted.  DIAGNOSTIC FINDINGS:  FINDINGS:  Mild lumbar dextroscoliosis. Vertebral body alignment is satisfactory. Vertebral body heights are preserved. Osteopenia. Moderate disc space loss with disc bulge and marginal osteophytes. Small lumbar Schmorl's nodes. Bilateral lumbar facet arthrosis. No central canal stenosis. Mild-moderate foraminal stenosis from L2-L3 through L5-S1. No prevertebral or paraspinal soft tissue abnormality. Aortoiliac vascular calcification. Mild degenerative changes of the sacroiliac joints.   IMPRESSION:  No acute fracture or subluxation of the lumbar spine.    COGNITION: Overall cognitive status: Within functional limits for tasks assessed     SENSATION: WFL  MUSCLE LENGTH: Hamstrings: tightness in BLE   POSTURE: rounded shoulders  PALPATION: TTP lumbar spine   LUMBAR ROM:   AROM eval 04/22/24  Flexion Can go just past knees with pain Limited 50%   Extension 75% slight pain Limited 50%  Right lateral flexion Mid thigh with pain Mid thigh w/ pain  Left lateral flexion Mid thigh with pain Mid thigh w/ pain  Right rotation WFL Limited 25%  Left rotation 75% Limited 25%   (Blank rows = not tested)  LOWER EXTREMITY ROM:   WFL some pain   LOWER EXTREMITY MMT:  5/5   LUMBAR SPECIAL TESTS:  Straight leg raise test: Positive, Slump test: Negative, and FABER test: Positive  FUNCTIONAL TESTS:  5 times sit to stand: 14.41s Timed up and go (TUG): 12.30s    TREATMENT DATE:  04/22/24 NuStep L 5 x 6 min Goals   ROM  5x S2S 13.50 sec Rows & lats 25lb 2x10 Shoulder Ext 10lb 2x10 6in step ups x10 each Hs curls 35lb 2x10 Leg Ext 10lb 2x10  PROM and stretching to bilateal LE   HS, ITB, Piriformis, Glute, K2C  02/26/24 NuStep L 5 x 6 min Slat board calf stretch Shoulder Ext 10lb 2x10 Bridges 2x10 PROM and stretching to bilateal LE   HS, ITB, Piriformis, Glute, K2C  02/20/24-EVAL                                                                                                                                PATIENT EDUCATION:  Education details: POC, HEP  Person educated: Patient Education method: Explanation Education comprehension: verbalized understanding  HOME EXERCISE PROGRAM: Access Code: [email or web address]   Exercises - Supine Lower Trunk Rotation  - 1 x daily - 7 x weekly - 2 sets - 10 reps - Supine Single Knee to Chest Stretch  - 1 x daily - 7 x weekly - 2 reps - 30 hold - Supine Piriformis Stretch with Foot on Ground  - 1 x daily - 7 x weekly - 2 reps - 30 hold - Supine Figure 4 Piriformis Stretch  - 1 x daily - 7 x weekly  - 2 reps - 30 hold - Supine Bridge  - 1 x daily - 7 x weekly - 2 sets - 10 reps   ASSESSMENT:  CLINICAL IMPRESSION: Patient is a 63 y.o. male who was seen today for physical therapy treatment for chronic LBP. He presents with an extension based preference, flexion causes a lot of pain. He returns to therapy after being in [place]  and [place] . He has see the MD here in [place] since ans repots that's he currently waiting to hear from the neurosurgeon. Patient presents with high pain levels. No functional progress has been made towards goals. All interventions completed well despite hight pain rating. No increase in pain during session.   OBJECTIVE IMPAIRMENTS: Abnormal gait, difficulty walking, decreased ROM, decreased strength, impaired flexibility, improper body mechanics, and pain.   ACTIVITY LIMITATIONS: carrying, lifting, bending, squatting, sleeping, and locomotion level  PARTICIPATION LIMITATIONS: cleaning, laundry, community activity, and yard work  PERSONAL FACTORS: Age, Past/current experiences, and Time since onset of injury/illness/exacerbation are also affecting patient's functional outcome.   REHAB POTENTIAL: Good  CLINICAL DECISION MAKING: Stable/uncomplicated  EVALUATION COMPLEXITY: Low   GOALS: Goals reviewed with patient? Yes  SHORT TERM GOALS: Target date: 04/02/24  Patient will be independent with initial HEP.  Baseline:  Goal status: INITIAL   LONG TERM GOALS: Target date: 05/14/24  Patient will be independent with advanced/ongoing HEP to improve outcomes and carryover.  Baseline:  Goal status: INITIAL  2.  Patient will report 50-75% improvement in low back pain to improve QOL. <4/10 Baseline: 8/10 Goal status: ongoing 04/22/24  3.  Patient will report centralization of radicular symptoms.  Baseline: has pain into bilateral thighs Goal status: Ongoing 04/22/24  4.  Patient will demonstrate full pain free lumbar ROM to perform ADLs.   Baseline: see chart Goal  status: Ongoing 04/22/24  5.  Patient will be able to return to jogging/running  Baseline: has not ran in 2 years  Goal status: Ongoing 04/22/24   PLAN:  PT FREQUENCY: 1x/week  PT DURATION: 12 weeks  PLANNED INTERVENTIONS: 97110-Therapeutic exercises, 97530- Therapeutic activity, 97112- Neuromuscular re-education, 97535- Self Care, [id number]- Manual therapy, [phone]- Gait training, [phone]- Electrical stimulation (unattended), 97035- Ultrasound, [id number]- Traction (mechanical), [id number]- Ionotophoresis 4mg /ml Dexamethasone, 79439 (1-2 muscles), [id number] (3+ muscles)- Dry Needling, Patient/Family education, Balance training, Stair training, Taping, Joint mobilization, Joint manipulation, Spinal manipulation, Spinal mobilization, Cryotherapy, and Moist heat.  PLAN FOR NEXT SESSION: low back and hip stretching, can try e-stim for pain and traction for radiculopathy    Tanda KANDICE Sorrow, PTA 04/22/2024, 2:23 PM

## 2024-04-26 NOTE — Progress Notes (Unsigned)
    Craig Jones [facility] [address] [place] [address] Phone: [phone]   Assessment and Plan:     There are no diagnoses linked to this encounter.  ***   Pertinent previous records reviewed include ***    Follow Up: ***     Subjective:   I, Craig Jones, am serving as a Neurosurgeon for Doctor Craig Jones  Chief Complaint: back pain  HPI:  02/01/2024 Craig Jones is a 63 y.o. male who presents to [facility] at [facility] today for low back pain. Pt's last visit for his low back was on 05/10/23   Today, pt c/o back pain x 5 days. Pt locates pain to bilateral lower back pain radiating into the gluteal region and the hips. Sx impacting ambulation. Radiating pain into the glutes and hips, n/t present. Minimal improvement with Cyclobenzaprine , IBU, or lidocaine patches.    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   Additionally he was seen in the hospital in [place]  last week where he had a CT scan of his T-spine and L-spine that did show degenerative changes but no acute fractures.     Exam:  BP 116/78   Pulse 86   Ht 5' 7 (1.702 m)   SpO2 99%   BMI 26.16 kg/m  General: Well Developed, well nourished, and in no acute distress.    MSK: T-spine nontender to palpation midline. L-spine nontender to palpation midline.  Tender palpation paraspinal musculature. Decreased lumbar motion. Lower extremity strength is intact.  04/29/2024 Patient states   Relevant Historical Information: ***  Additional  pertinent review of systems negative.   Current Outpatient Medications:    aspirin 81 MG EC tablet, Take 81 mg by mouth daily., Disp: , Rfl:    Baclofen 5 MG TABS, Take 5 mg by mouth daily., Disp: , Rfl:    cyclobenzaprine  (FLEXERIL ) 10 MG tablet, Take 1 tablet (10 mg total) by mouth 2 (two) times daily as needed for up to 20 doses for muscle spasms., Disp: 20 tablet, Rfl: 0   DULoxetine (CYMBALTA) 60 MG capsule, Take 60 mg by mouth daily., Disp: , Rfl:    gabapentin (NEURONTIN) 300 MG capsule, Take 300 mg by mouth at bedtime., Disp: , Rfl:    losartan-hydrochlorothiazide (HYZAAR) 100-25 MG tablet, Take 1 tablet by mouth daily., Disp: , Rfl:    metoprolol tartrate (LOPRESSOR) 50 MG tablet, Take 50 mg by mouth daily., Disp: , Rfl:    pantoprazole (PROTONIX) 20 MG tablet, Take 1 tablet by mouth daily., Disp: , Rfl:    rosuvastatin (CRESTOR) 40 MG tablet, Take 20 mg by mouth daily. Take 2 tabs daily, Disp: , Rfl:    Objective:     There were no vitals filed for this visit.    There is no height or weight on file to calculate BMI.    Physical Exam:    ***   Electronically signed by:  Craig Jones [facility] 7:47 AM 04/26/24

## 2024-04-29 ENCOUNTER — Ambulatory Visit (INDEPENDENT_AMBULATORY_CARE_PROVIDER_SITE_OTHER): Admitting: Sports Medicine

## 2024-04-29 VITALS — BP 122/74 | HR 64 | Ht 67.0 in | Wt 173.0 lb

## 2024-04-29 DIAGNOSIS — G8929 Other chronic pain: Secondary | ICD-10-CM

## 2024-04-29 DIAGNOSIS — M533 Sacrococcygeal disorders, not elsewhere classified: Secondary | ICD-10-CM

## 2024-04-29 DIAGNOSIS — M51362 Other intervertebral disc degeneration, lumbar region with discogenic back pain and lower extremity pain: Secondary | ICD-10-CM | POA: Diagnosis not present

## 2024-04-29 DIAGNOSIS — M25511 Pain in right shoulder: Secondary | ICD-10-CM | POA: Diagnosis not present

## 2024-04-29 MED ORDER — MELOXICAM 15 MG PO TABS: 15.0000 mg | ORAL_TABLET | Freq: Every day | ORAL | 0 refills | Status: DC

## 2024-04-29 NOTE — Patient Instructions (Addendum)
-   Start meloxicam  15 mg daily x2 weeks.  If still having pain after 2 weeks, complete 3rd-week of NSAID. May use remaining NSAID as needed once daily for pain control.  Do not to use additional over-the-counter NSAIDs (ibuprofen, naproxen, Advil, Aleve, etc.) while taking prescription NSAIDs.  May use Tylenol  [phone] mg 2 to 3 times a day for breakthrough pain. See me in 4-5 weeks

## 2024-05-24 NOTE — Progress Notes (Signed)
 Ben Wandell Scullion D.[name] [address] [place] [address] Phone: [phone]   Assessment and Plan:     1. Chronic right shoulder pain (Primary) -Chronic with exacerbation, subsequent visit - Overall 50% improvement in shoulder pain after completing course of meloxicam , however continued limited ROM, pain with day-to-day activities.  Still most consistent with subacromial bursitis versus rotator cuff pathology - Will obtain x-ray at today's visit and can further review at follow-up visit - Use meloxicam  15 mg daily as needed for pain.  Recommend limiting chronic NSAIDs to 1-2 doses per week to prevent long-term side effects. - Use Tylenol  500 to 1000 mg tablets 2-3 times a day for day-to-day pain relief -Continue HEP for rotator cuff - Patient elected for subacromial CSI.  Tolerated well per note below  Procedure: Subacromial Injection Side: Right  Risks explained and consent was given verbally. The site was cleaned with alcohol prep. A steroid injection was performed from posterior approach using 2mL of 1% lidocaine without epinephrine and 1mL of kenalog 40mg /ml. This was well tolerated.  Needle was removed, hemostasis achieved, and post injection instructions were explained.   Pt was advised to call or return to clinic if these symptoms worsen or fail to improve as anticipated.     Pertinent previous records reviewed include none   Follow Up: 4 weeks for reevaluation.  Could consider alternative CSI for shoulder.  Could consider facet injections versus epidural CSI if back pain returns.  Would review shoulder x-ray with patient   Subjective:   I, Claretha Schimke am a scribe for Dr. Leonce.     Chief Complaint: back pain   HPI:  02/01/2024 Craig Jones is a 63 y.o. male who presents to [facility] at [facility] today for low back pain. Pt's last visit for his low back was on 05/10/23   Today, pt c/o back pain x 5  days. Pt locates pain to bilateral lower back pain radiating into the gluteal region and the hips. Sx impacting ambulation. Radiating pain into the glutes and hips, n/t present. Minimal improvement with Cyclobenzaprine , IBU, or lidocaine patches.    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   Additionally he was seen in the hospital in [place]  last week where he had a CT scan of his T-spine and L-spine that did show degenerative changes but no acute fractures.     Exam:  BP 116/78   Pulse 86   Ht 5' 7 (1.702 m)   SpO2 99%   BMI 26.16 kg/m  General: Well Developed, well nourished, and in no acute distress.    MSK: T-spine nontender to palpation midline. L-spine nontender to palpation midline.  Tender palpation paraspinal musculature. Decreased lumbar motion. Lower extremity strength is intact.   04/29/2024 Patient states that his is no longer having pain in L hip. Pain persists in lower back. Pain improves with cortisone injections given by Dr. Joane. Discontinued medications gabapentin and Cymbalta. Using Tylenol  for pain relief instead. Also uses supplements  Vit D, magnesium, Vit B 12. Does feel like supplements are helping. Hx of surgery in 2023 in [place] where he had nerves burning. Patient was suppose to do PT but has to take care of wife.    Pain in R shoulder for past month throughout entire joint. Unable to move arm overhead.    05/27/2024 Patient states he has pain in the right shoulder. Would like a refill of meloxicam  because yesterday the prescription ran out. The  medication helps but it isn't 100%. Still can't raise his arm, do things overhead and activities of daily living.   Relevant Historical Information: Hypertension, status post CABG x 2  Additional pertinent review of systems negative.   Current Outpatient Medications:    aspirin 81 MG EC tablet, Take 81 mg by mouth daily., Disp: , Rfl:    Baclofen 5 MG TABS, Take 5 mg by mouth daily., Disp: , Rfl:    cyclobenzaprine  (FLEXERIL ) 10 MG tablet, Take 1 tablet (10 mg total) by mouth 2 (two) times daily as needed for up to 20 doses for muscle spasms., Disp: 20 tablet, Rfl: 0   losartan-hydrochlorothiazide (HYZAAR) 100-25 MG tablet, Take 1 tablet by mouth daily., Disp: , Rfl:    metoprolol tartrate (LOPRESSOR) 50 MG tablet, Take 50 mg by mouth daily., Disp: , Rfl:    pantoprazole (PROTONIX) 20 MG tablet, Take 1 tablet by mouth daily., Disp: , Rfl:    rosuvastatin (CRESTOR) 40 MG tablet, Take 20 mg by mouth daily. Take 2 tabs daily, Disp: , Rfl:    meloxicam  (MOBIC ) 15 MG tablet, Take 1 tablet (15 mg total) by mouth daily as needed for pain., Disp: 30 tablet, Rfl: 0   Objective:     Vitals:   05/27/24 1440  BP: 124/60  Pulse: 83  SpO2: 96%  Weight: 177 lb 9.6 oz (80.6 kg)  Height: 5' 7 (1.702 m)      Body mass index is 27.82 kg/m.    Physical Exam:      Gen: Appears well, nad, nontoxic and pleasant Neuro:sensation intact, strength is 5/5 with df/pf/inv/ev, muscle tone wnl Skin: no suspicious lesion or defmority Psych: A&O, appropriate mood and affect   Right shoulder:  No deformity, swelling or muscle wasting No scapular winging FF 150, abd 130, int 20, ext 70 NTTP over the [place], clavicle, ac, coracoid, biceps groove, humerus, deltoid, trapezius, cervical spine Positive neer, hawkins, empty can, obriens, crossarm, subscap liftoff, speeds Neg ant drawer, sulcus sign, apprehension Negative Spurling's test bilat FROM of neck     Electronically signed by:  [name] D.[name] 2:57 PM 05/27/24

## 2024-05-27 ENCOUNTER — Ambulatory Visit (INDEPENDENT_AMBULATORY_CARE_PROVIDER_SITE_OTHER)

## 2024-05-27 ENCOUNTER — Ambulatory Visit (INDEPENDENT_AMBULATORY_CARE_PROVIDER_SITE_OTHER): Admitting: Sports Medicine

## 2024-05-27 VITALS — BP 124/60 | HR 83 | Ht 67.0 in | Wt 177.6 lb

## 2024-05-27 DIAGNOSIS — M5442 Lumbago with sciatica, left side: Secondary | ICD-10-CM | POA: Diagnosis not present

## 2024-05-27 DIAGNOSIS — G8929 Other chronic pain: Secondary | ICD-10-CM

## 2024-05-27 DIAGNOSIS — M25511 Pain in right shoulder: Secondary | ICD-10-CM

## 2024-05-27 DIAGNOSIS — M5441 Lumbago with sciatica, right side: Secondary | ICD-10-CM

## 2024-05-27 MED ORDER — MELOXICAM 15 MG PO TABS: 15.0000 mg | ORAL_TABLET | Freq: Every day | ORAL | 0 refills | Status: AC | PRN

## 2024-05-27 NOTE — Patient Instructions (Signed)
 Xrays on the way out   Continue HEP   - Use meloxicam  15 mg daily as needed for pain.  Recommend limiting chronic NSAIDs to 1-2 doses per week to prevent long-term side effects.   4 week follow up

## 2024-06-03 ENCOUNTER — Ambulatory Visit: Payer: Self-pay | Admitting: Sports Medicine

## 2024-06-24 ENCOUNTER — Ambulatory Visit: Admitting: Sports Medicine

## 2024-06-24 VITALS — HR 91 | Ht 67.0 in | Wt 176.0 lb

## 2024-06-24 DIAGNOSIS — M25511 Pain in right shoulder: Secondary | ICD-10-CM

## 2024-06-24 DIAGNOSIS — G8929 Other chronic pain: Secondary | ICD-10-CM | POA: Diagnosis not present

## 2024-06-24 MED ORDER — MELOXICAM 15 MG PO TABS: 15.0000 mg | ORAL_TABLET | Freq: Every day | ORAL | 0 refills | Status: AC | PRN

## 2024-06-24 NOTE — Patient Instructions (Signed)
-   Use meloxicam  15 mg daily as needed for breakthrough pain.  Recommend limiting chronic NSAIDs to 1-2 doses per week to prevent long-term side effects. Use Tylenol 500 to 1000 mg tablets 2-3 times a day as needed for day-to-day pain relief.    Refill meloxicam    Continue HEP   As needed follow up

## 2024-06-24 NOTE — Progress Notes (Signed)
 Ben Zuma Hust D.[name] [address] [place] [address] Phone: [phone]   Assessment and Plan:     1. Chronic right shoulder pain (Primary) -Chronic with exacerbation, subsequent visit - Overall significant improvement in right shoulder pain after subacromial CSI on 05/27/2024.  Patient had moderate relief from meloxicam  course, but more significant relief with subacromial CSI.  Consistent with resolving flare of subacromial bursitis - Continue HEP for shoulder and rotator cuff - Use meloxicam  15 mg daily as needed for breakthrough pain.  Recommend limiting chronic NSAIDs to 1-2 doses per week to prevent long-term side effects. Use Tylenol  500 to 1000 mg tablets 2-3 times a day as needed for day-to-day pain relief.    -Reviewed patient's x-ray.  My interpretation: No acute fracture or dislocation  Pertinent previous records reviewed include right shoulder x-ray 05/27/2024   Follow Up: As needed if no improvement or worsening of symptoms.  Could consider repeat shoulder CSI.  Could consider repeat facet injections versus epidural CSI if back pain returns   Subjective:   I, Moenique Parris, am serving as a Neurosurgeon for Doctor Morene Mace  Chief Complaint: back pain   HPI:  02/01/2024 Cobain Morici is a 63 y.o. male who presents to [facility] at [facility] today for low back pain. Pt's last visit for his low back was on 05/10/23   Today, pt c/o back pain x 5 days. Pt locates pain to bilateral lower back pain radiating into the gluteal region and the hips. Sx impacting ambulation. Radiating pain into the glutes and hips, n/t present. Minimal improvement with Cyclobenzaprine , IBU, or lidocaine patches.    Radiating pain: hips/glutes LE numbness/tingling: yes LE weakness: yes Aggravates: ambulation Treatments tried:    Dx imaging: 06/22/23 L-spine & sacrum/SI joints MRI             05/06/23 L-spine XR   Pertinent review  of systems: No fevers or chills   Relevant historical information: Patient mentions a few years ago in [place] he had what sounds like a medial branch block and ablation in his lumbar spine that worked well for about a year and a half.   Additionally he was seen in the hospital in [place]  last week where he had a CT scan of his T-spine and L-spine that did show degenerative changes but no acute fractures.     Exam:  BP 116/78   Pulse 86   Ht 5' 7 (1.702 m)   SpO2 99%   BMI 26.16 kg/m  General: Well Developed, well nourished, and in no acute distress.    MSK: T-spine nontender to palpation midline. L-spine nontender to palpation midline.  Tender palpation paraspinal musculature. Decreased lumbar motion. Lower extremity strength is intact.   04/29/2024 Patient states that his is no longer having pain in L hip. Pain persists in lower back. Pain improves with cortisone injections given by Dr. Joane. Discontinued medications gabapentin and Cymbalta. Using Tylenol  for pain relief instead. Also uses supplements Vit D, magnesium, Vit B 12. Does feel like supplements are helping. Hx of surgery in 2023 in [place] where he had nerves burning. Patient was suppose to do PT but has to take care of wife.    Pain in R shoulder for past month throughout entire joint. Unable to move arm overhead.    05/27/2024 Patient states he has pain in the right shoulder. Would like a refill of meloxicam  because yesterday the prescription ran out. The medication  helps but it isn't 100%. Still can't raise his arm, do things overhead and activities of daily living.   06/24/2024 Patient states he is doing alright . Meloxicam  and tylenol     Relevant Historical Information: Hypertension, status post CABG x 2  Additional pertinent review of systems negative.   Current Outpatient Medications:    meloxicam  (MOBIC ) 15 MG tablet, Take 1 tablet (15 mg total) by mouth daily as needed for pain. Take 1 tablet daily for 2 weeks.   If still in pain after 2 weeks, take 1 tablet daily for an additional 1 week., Disp: 30 tablet, Rfl: 0   aspirin 81 MG EC tablet, Take 81 mg by mouth daily., Disp: , Rfl:    Baclofen 5 MG TABS, Take 5 mg by mouth daily., Disp: , Rfl:    cyclobenzaprine  (FLEXERIL ) 10 MG tablet, Take 1 tablet (10 mg total) by mouth 2 (two) times daily as needed for up to 20 doses for muscle spasms., Disp: 20 tablet, Rfl: 0   losartan-hydrochlorothiazide (HYZAAR) 100-25 MG tablet, Take 1 tablet by mouth daily., Disp: , Rfl:    meloxicam  (MOBIC ) 15 MG tablet, Take 1 tablet (15 mg total) by mouth daily as needed for pain., Disp: 30 tablet, Rfl: 0   metoprolol tartrate (LOPRESSOR) 50 MG tablet, Take 50 mg by mouth daily., Disp: , Rfl:    pantoprazole (PROTONIX) 20 MG tablet, Take 1 tablet by mouth daily., Disp: , Rfl:    rosuvastatin (CRESTOR) 40 MG tablet, Take 20 mg by mouth daily. Take 2 tabs daily, Disp: , Rfl:    Objective:     Vitals:   06/24/24 1430  Pulse: 91  SpO2: 97%  Weight: 176 lb (79.8 kg)  Height: 5' 7 (1.702 m)      Body mass index is 27.57 kg/m.    Physical Exam:    Gen: Appears well, nad, nontoxic and pleasant Neuro:sensation intact, strength is 5/5 with df/pf/inv/ev, muscle tone wnl Skin: no suspicious lesion or defmority Psych: A&O, appropriate mood and affect  Right shoulder:  No deformity, swelling or muscle wasting No scapular winging FF 180, abd 180, int 0, ext 90 NTTP over the [place], clavicle, ac, coracoid, biceps groove, humerus, deltoid, trapezius, cervical spine FROM of neck    Electronically signed by:  [name] D[name] 2:44 PM 06/24/24
